# Patient Record
Sex: Female | Born: 1935
Health system: Southern US, Community
[De-identification: ages and names within clinical notes are randomized; demographics above are authoritative.]

## PROBLEM LIST (undated history)

## (undated) DIAGNOSIS — E785 Hyperlipidemia, unspecified: Secondary | ICD-10-CM

## (undated) DIAGNOSIS — E039 Hypothyroidism, unspecified: Secondary | ICD-10-CM

## (undated) DIAGNOSIS — R0600 Dyspnea, unspecified: Secondary | ICD-10-CM

## (undated) DIAGNOSIS — F419 Anxiety disorder, unspecified: Secondary | ICD-10-CM

## (undated) DIAGNOSIS — E079 Disorder of thyroid, unspecified: Secondary | ICD-10-CM

## (undated) DIAGNOSIS — F32A Depression, unspecified: Secondary | ICD-10-CM

## (undated) DIAGNOSIS — M1990 Unspecified osteoarthritis, unspecified site: Secondary | ICD-10-CM

## (undated) DIAGNOSIS — C439 Malignant melanoma of skin, unspecified: Secondary | ICD-10-CM

## (undated) DIAGNOSIS — I10 Essential (primary) hypertension: Secondary | ICD-10-CM

## (undated) DIAGNOSIS — F329 Major depressive disorder, single episode, unspecified: Secondary | ICD-10-CM

## (undated) DIAGNOSIS — C801 Malignant (primary) neoplasm, unspecified: Secondary | ICD-10-CM

## (undated) HISTORY — DX: Essential (primary) hypertension: I10

## (undated) HISTORY — DX: Disorder of thyroid, unspecified: E07.9

## (undated) HISTORY — DX: Malignant (primary) neoplasm, unspecified: C80.1

## (undated) HISTORY — DX: Depression, unspecified: F32.A

## (undated) HISTORY — DX: Malignant melanoma of skin, unspecified: C43.9

## (undated) HISTORY — DX: Hyperlipidemia, unspecified: E78.5

## (undated) HISTORY — PX: DILATION AND CURETTAGE OF UTERUS: SHX78

## (undated) HISTORY — PX: EXCISION OF BACK LESION: SHX6597

---
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
## 1978-04-06 HISTORY — PX: TUBAL LIGATION: SHX77

## 1999-03-20 DIAGNOSIS — C4492 Squamous cell carcinoma of skin, unspecified: Secondary | ICD-10-CM

## 1999-03-20 HISTORY — DX: Squamous cell carcinoma of skin, unspecified: C44.92

## 2016-04-01 ENCOUNTER — Ambulatory Visit: Payer: Medicare HMO | Admitting: Physical Therapy

## 2016-04-02 ENCOUNTER — Ambulatory Visit: Payer: Medicare HMO | Attending: Orthopedic Surgery | Admitting: Physical Therapy

## 2016-04-02 DIAGNOSIS — G8929 Other chronic pain: Secondary | ICD-10-CM | POA: Diagnosis present

## 2016-04-02 DIAGNOSIS — R293 Abnormal posture: Secondary | ICD-10-CM | POA: Diagnosis present

## 2016-04-02 DIAGNOSIS — M545 Low back pain: Secondary | ICD-10-CM | POA: Insufficient documentation

## 2016-04-02 NOTE — Therapy (Signed)
[facility] [place], [place], [address] Phone: [phone]   Fax:  [phone]  Physical Therapy Evaluation  Patient Details  Name: Mary Marks MRN: [id number] Date of Birth: 02/05/1936 Referring Provider: Ronette Deter PA-C  Encounter Date: 04/02/2016      PT End of Session - 04/02/16 1205    Visit Number 1   Number of Visits 16   Date for PT Re-Evaluation 06/01/16   PT Start Time 0947   PT Stop Time 1042   PT Time Calculation (min) 55 min   Activity Tolerance Patient tolerated treatment well   Behavior During Therapy [facility] for tasks assessed/performed      No past medical history on file.  No past surgical history on file.  There were no vitals filed for this visit.       Subjective Assessment - 04/02/16 1210    Subjective The patient reports she has had low back pain for the last 5 years.  However, as of late her back pain has been worsening which includes pain radiating down her right LE to the level of her knee.  She recently received an injection which she states helped but she had two very long drives and the pain returned.  She reports that during Christmas time she was up alot do household activites and her became severe rated at a 9-10/10.  Medication decreases her pain and activity increases her pain.   Limitations Sitting;Standing;Walking   Patient Stated Goals Get out of pain.   Currently in Pain? Yes   Pain Score 9    Pain Location Back   Pain Orientation Right;Left;Mid;Lower   Pain Descriptors / Indicators Aching;Constant;Throbbing   Pain Type Chronic pain   Pain Onset More than a month ago   Pain Frequency Constant   Aggravating Factors  See above.   Pain Relieving Factors See above.            [facility] PT Assessment - 04/02/16 0001      Assessment   Medical Diagnosis Acute right sided low back pain.   Referring Provider Ronette Deter PA-C   Onset Date/Surgical Date --  5 years.     Precautions   Precautions None     Restrictions   Weight Bearing Restrictions No     Balance Screen   Has the patient fallen in the past 6 months Yes   How many times? --  2.   Has the patient had a decrease in activity level because of a fear of falling?  Yes   Is the patient reluctant to leave their home because of a fear of falling?  Yes     Prior Function   Level of Independence Independent     Posture/Postural Control   Posture/Postural Control Postural limitations   Postural Limitations Rounded Shoulders;Forward head;Decreased lumbar lordosis;Posterior pelvic tilt;Flexed trunk   Posture Comments Bilateral genu valgum.     ROM / Strength   AROM / PROM / Strength AROM;Strength     AROM   Overall AROM Comments Lumbar flexion limited by 50% and lumbar extension. limites to neutral.     Strength   Overall Strength Comments Normal bilateral Le strength.     Palpation   Palpation comment Tender at L4 to S1 and across bilaterally from this region right > left with radiation of pain into her right LE to level of her knee.     Special Tests    Special Tests Lumbar  c/o pain with  SLR testing;(=) leg lengths;(-)FABER testing.   Lumbar Tests --  Essentially absent bilateral LE DTR's.     Ambulation/Gait   Gait Comments Antalgic gait with patient walking in flexion in obvious pain.                   [facility] Adult PT Treatment/Exercise - Apr 14, 2016 0001      Modalities   Modalities Electrical Stimulation;Moist Heat     Moist Heat Therapy   Number Minutes Moist Heat 15 Minutes   Moist Heat Location Lumbar Spine     Electrical Stimulation   Electrical Stimulation Location Low back   Electrical Stimulation Action IFC   Electrical Stimulation Parameters 80-150 Hz at 100% scan x 15 minutes.   Electrical Stimulation Goals Pain                  PT Short Term Goals - 2016/04/14 1236      PT SHORT TERM GOAL #1   Title STG's=LTG's.           PT Long  Term Goals - 2016/04/14 1236      PT LONG TERM GOAL #1   Title Independent with a HEP.   Time 8   Period Weeks   Status New     PT LONG TERM GOAL #2   Title Perform ADL's with pain not > 3/10.   Time 8   Period Weeks   Status New     PT LONG TERM GOAL #3   Title Eliminate right LE pain.   Time 8   Period Weeks   Status New               Plan - 04/14/2016 1215    Clinical Impression Statement The patient is in severe pain today with limitations of spinal range of motion.  Her pain limits her greatly her ability to perform ADL's.  This situation is evolving and worsenening.  She walks in spinal flexion due to her high pain-level.   Rehab Potential Good   PT Frequency 2x / week   PT Duration 8 weeks   PT Treatment/Interventions ADLs/Self Care Home Management;Cryotherapy;Electrical Stimulation;Moist Heat;Ultrasound;Patient/family education;Therapeutic exercise;Therapeutic activities;Manual techniques;Dry needling   PT Next Visit Plan Modalites and STW/M to patient's lower lumbar region f/b low-lwvwl core exercise progression.   Consulted and Agree with Plan of Care Patient      Patient will benefit from skilled therapeutic intervention in order to improve the following deficits and impairments:  Pain, Decreased activity tolerance, Decreased range of motion, Postural dysfunction  Visit Diagnosis: Chronic right-sided low back pain, with sciatica presence unspecified  Abnormal posture      G-Codes - 14-Apr-2016 1206    Functional Assessment Tool Used Clinical judgement...   Functional Limitation Mobility: Walking and moving around   Mobility: Walking and Moving Around Current Status [phone]) At least 40 percent but less than 60 percent impaired, limited or restricted   Mobility: Walking and Moving Around Goal Status [phone]) At least 1 percent but less than 20 percent impaired, limited or restricted       Problem List There are no active problems to display for this  patient.   APPLEGATE, [place] MPT 14-Apr-2016, 12:38 PM  [facility] [place], [place], [address] Phone: [phone]   Fax:  [phone]  Name: Mary Marks MRN: [id number] Date of Birth: April 25, 1935

## 2016-04-07 ENCOUNTER — Ambulatory Visit: Payer: Medicare HMO | Attending: Orthopedic Surgery | Admitting: Physical Therapy

## 2016-04-07 DIAGNOSIS — R293 Abnormal posture: Secondary | ICD-10-CM | POA: Insufficient documentation

## 2016-04-07 DIAGNOSIS — G8929 Other chronic pain: Secondary | ICD-10-CM | POA: Diagnosis present

## 2016-04-07 DIAGNOSIS — M545 Low back pain: Secondary | ICD-10-CM | POA: Diagnosis not present

## 2016-04-07 NOTE — Patient Instructions (Signed)
Quadriceps (Prone)   On stomach with sheet around ankles, knees together, hips down, pull heels toward bottom. Keep hips flat. Hold __60__ seconds. Repeat _3__ times. Do _2-3__ sessions per day. CAUTION: Stretch should be gentle, steady and slow.  Leg Extension (Hamstring)   Sit toward front edge of chair, with leg out straight, heel on floor or on a table, toes pointing toward body. Keeping back straight, bend forward at hip, until a stretch is felt. Hold 30-60 seconds. Repeat _3__ times. Repeat with other leg. Do __2-3_ sessions per day.  Mary Marks, PT 04/07/16 2:35 PM [facility] [place], [place], [address] Phone: [phone]   Fax:  [phone]

## 2016-04-07 NOTE — Therapy (Signed)
[facility] [place], [place], [address] Phone: [phone]   Fax:  [phone]  Physical Therapy Treatment  Patient Details  Name: Mary Marks MRN: [id number] Date of Birth: Nov 20, 1935 Referring Provider: Ronette Deter PA-C  Encounter Date: 04/07/2016      PT End of Session - 04/07/16 1353    Visit Number 2   Number of Visits 16   Date for PT Re-Evaluation 06/01/16   PT Start Time 0037   PT Stop Time 1450   PT Time Calculation (min) 57 min   Activity Tolerance Patient tolerated treatment well   Behavior During Therapy [facility] for tasks assessed/performed      No past medical history on file.  No past surgical history on file.  There were no vitals filed for this visit.      Subjective Assessment - 04/07/16 1353    Subjective Patient reports her pain comes and goes. Her pain feels better when she is moving.   Patient Stated Goals Get out of pain.   Currently in Pain? Yes   Pain Score 6    Pain Location Back   Pain Orientation Right;Left                         [facility] Adult PT Treatment/Exercise - 04/07/16 0001      Exercises   Exercises Lumbar     Lumbar Exercises: Stretches   Active Hamstring Stretch 1 rep;60 seconds   Active Hamstring Stretch Limitations seated   Quadruped Mid Back Stretch 1 rep;20 seconds  done in standing   Quadruped Mid Back Stretch Limitations patient only felt slightly   Quad Stretch 2 reps;60 seconds  B; with strap     Modalities   Modalities Electrical Stimulation;Moist Heat     Moist Heat Therapy   Number Minutes Moist Heat 15 Minutes   Moist Heat Location Lumbar Spine     Electrical Stimulation   Electrical Stimulation Location lumbar paraspinals IFC 80-150 Hz x 15 min    Electrical Stimulation Goals Pain     Manual Therapy   Manual Therapy Soft tissue mobilization;Myofascial release   Soft tissue mobilization B lumbar paraspinals and QL   Myofascial  Release to lumbar paraspinals                PT Education - 04/07/16 1559    Education provided Yes   Education Details HEP   Person(s) Educated Patient;Spouse   Methods Explanation;Demonstration;Handout   Comprehension Verbalized understanding;Returned demonstration          PT Short Term Goals - 04/02/16 1236      PT SHORT TERM GOAL #1   Title STG's=LTG's.           PT Long Term Goals - 04/02/16 1236      PT LONG TERM GOAL #1   Title Independent with a HEP.   Time 8   Period Weeks   Status New     PT LONG TERM GOAL #2   Title Perform ADL's with pain not > 3/10.   Time 8   Period Weeks   Status New     PT LONG TERM GOAL #3   Title Eliminate right LE pain.   Time 8   Period Weeks   Status New               Plan - 04/07/16 1601    Clinical Impression Statement Patient demos extreme tightness in B  quadriceps. She tolerated stretches very well to HS as well. She did well with deep tissue work as well. Normal response to modalities. No goals met as only second visit.   Rehab Potential Good   PT Frequency 2x / week   PT Duration 8 weeks   PT Treatment/Interventions ADLs/Self Care Home Management;Cryotherapy;Electrical Stimulation;Moist Heat;Ultrasound;Patient/family education;Therapeutic exercise;Therapeutic activities;Manual techniques;Dry needling   PT Next Visit Plan Modalites and STW/M to patient's lower lumbar region f/b low-lwvwl core exercise progression.   Consulted and Agree with Plan of Care Patient      Patient will benefit from skilled therapeutic intervention in order to improve the following deficits and impairments:  Pain, Decreased activity tolerance, Decreased range of motion, Postural dysfunction  Visit Diagnosis: Chronic right-sided low back pain, with sciatica presence unspecified  Abnormal posture     Problem List There are no active problems to display for this patient.  Madelyn Flavors PT 04/07/2016, 4:04 PM  [facility] [place], [place], [address] Phone: [phone]   Fax:  [phone]  Name: Mary Marks MRN: [id number] Date of Birth: 16-Feb-1936

## 2016-04-09 ENCOUNTER — Ambulatory Visit: Payer: Medicare HMO | Admitting: Physical Therapy

## 2016-04-09 DIAGNOSIS — G8929 Other chronic pain: Secondary | ICD-10-CM

## 2016-04-09 DIAGNOSIS — M545 Low back pain: Principal | ICD-10-CM

## 2016-04-09 DIAGNOSIS — R293 Abnormal posture: Secondary | ICD-10-CM

## 2016-04-09 NOTE — Therapy (Signed)
[facility] [place], [place], [address] Phone: [phone]   Fax:  [phone]  Physical Therapy Treatment  Patient Details  Name: Mary Marks MRN: [id number] Date of Birth: 07-03-1935 Referring Provider: Ronette Deter PA-C  Encounter Date: 04/09/2016      PT End of Session - 04/09/16 1357    Visit Number 3   Number of Visits 16   Date for PT Re-Evaluation 06/01/16   PT Start Time [id number]   PT Stop Time 1433   PT Time Calculation (min) 50 min   Activity Tolerance Patient tolerated treatment well   Behavior During Therapy [facility] for tasks assessed/performed      No past medical history on file.  No past surgical history on file.  There were no vitals filed for this visit.      Subjective Assessment - 04/09/16 1353    Subjective Reports that she really hasn't had much pain since previous treatment. Had some pain waking and after getting up from dentist chair.   Limitations Sitting;Standing;Walking   Patient Stated Goals Get out of pain.   Currently in Pain? No/denies            [facility] PT Assessment - 04/09/16 0001      Assessment   Medical Diagnosis Acute right sided low back pain.   Next MD Visit 04/24/2016     Precautions   Precautions None     Restrictions   Weight Bearing Restrictions No                     [facility] Adult PT Treatment/Exercise - 04/09/16 0001      Lumbar Exercises: Stretches   Single Knee to Chest Stretch 30 seconds;2 reps  BEgan reporting pain with RLE   Single Knee to Chest Stretch Limitations --   Quad Stretch Other (comment)  Attempted but unable secondary to LBP in prone     Lumbar Exercises: Aerobic   Stationary Bike NuStep L5 x15 min     Modalities   Modalities Electrical Stimulation;Moist Heat     Moist Heat Therapy   Number Minutes Moist Heat 15 Minutes   Moist Heat Location Lumbar Spine     Electrical Stimulation   Electrical Stimulation Location B lumbar  paraspinals   Electrical Stimulation Action IFC   Electrical Stimulation Parameters 1-10 hz x15 min   Electrical Stimulation Goals Pain     Manual Therapy   Manual Therapy Soft tissue mobilization   Soft tissue mobilization STW to L lumbar paraspinals and QL to decrease tightness in R sidelying                  PT Short Term Goals - 04/02/16 1236      PT SHORT TERM GOAL #1   Title STG's=LTG's.           PT Long Term Goals - 04/02/16 1236      PT LONG TERM GOAL #1   Title Independent with a HEP.   Time 8   Period Weeks   Status New     PT LONG TERM GOAL #2   Title Perform ADL's with pain not > 3/10.   Time 8   Period Weeks   Status New     PT LONG TERM GOAL #3   Title Eliminate right LE pain.   Time 8   Period Weeks   Status New  Plan - 04/09/16 1553    Clinical Impression Statement Patient presented in clinic with denial of any current pain. Patient did experience low back discomfort upon waking this morning as well as when getting up from denist chair today. Patient able to tolerate NuStep warm up well with no complaints following end of warmup although patient reported that was more activity than she had done recently. Patient attempted quad and SKTC stretches today but experienced discomfort with both. Patient also presented with increased tightness of L lumbar paraspinals and QL although R lumbar musculature would have been assessed but unable secondary to patient lying in R sidelying. Normal modalities response noted following removal of the modalities. Patient experienced low back feeling "good" following end of treatment per patient report.    Rehab Potential Good   PT Frequency 2x / week   PT Duration 8 weeks   PT Treatment/Interventions ADLs/Self Care Home Management;Cryotherapy;Electrical Stimulation;Moist Heat;Ultrasound;Patient/family education;Therapeutic exercise;Therapeutic activities;Manual techniques;Dry needling   PT  Next Visit Plan Modalites and STW/M to patient's lower lumbar region f/b low-lwvwl core exercise progression.   Consulted and Agree with Plan of Care Patient      Patient will benefit from skilled therapeutic intervention in order to improve the following deficits and impairments:  Pain, Decreased activity tolerance, Decreased range of motion, Postural dysfunction  Visit Diagnosis: Chronic right-sided low back pain, with sciatica presence unspecified  Abnormal posture     Problem List There are no active problems to display for this patient.   Wynelle Fanny, PTA 04/09/2016, 3:59 PM  [facility] [address] [place], [place], [address] Phone: [phone]   Fax:  [phone]  Name: Mary Marks MRN: [id number] Date of Birth: 1936-03-25

## 2016-04-14 ENCOUNTER — Ambulatory Visit: Payer: Medicare HMO | Admitting: Physical Therapy

## 2016-04-14 DIAGNOSIS — M545 Low back pain: Secondary | ICD-10-CM | POA: Diagnosis not present

## 2016-04-14 DIAGNOSIS — G8929 Other chronic pain: Secondary | ICD-10-CM

## 2016-04-14 NOTE — Therapy (Signed)
[facility] [place], [place], [address] Phone: [phone]   Fax:  [phone]  Physical Therapy Treatment  Patient Details  Name: Mary Marks MRN: [id number] Date of Birth: May 19, 1935 Referring Provider: Ronette Deter PA-C  Encounter Date: 04/14/2016      PT End of Session - 04/14/16 1347    Visit Number 4   Number of Visits 16   Date for PT Re-Evaluation 06/01/16   PT Start Time [id number]   PT Stop Time 1448   PT Time Calculation (min) 61 min   Activity Tolerance Patient tolerated treatment well   Behavior During Therapy [facility] for tasks assessed/performed      No past medical history on file.  No past surgical history on file.  There were no vitals filed for this visit.      Subjective Assessment - 04/14/16 1352    Subjective Patient states that she has had increased pain since last visit in back and BLE. She states it is difficult to walk.   Limitations Sitting;Standing;Walking   Patient Stated Goals Get out of pain.   Currently in Pain? Yes   Pain Score 8                          [facility] Adult PT Treatment/Exercise - 04/14/16 0001      Lumbar Exercises: Stretches   Lower Trunk Rotation 5 reps;10 seconds   Pelvic Tilt --  10 reps with PT assist   Pelvic Tilt Limitations required TCs    Quad Stretch --  AA 20 reps of prone knee flex with prolonged hold at end.     Lumbar Exercises: Aerobic   Stationary Bike L1 x 5 min     Lumbar Exercises: Supine   Ab Set --   Bent Knee Raise --   Bridge 20 reps     Modalities   Modalities Electrical Stimulation;Moist Heat     Moist Heat Therapy   Number Minutes Moist Heat 15 Minutes   Moist Heat Location Lumbar Spine     Electrical Stimulation   Electrical Stimulation Location lumbar paraspinals IFC 80-150 Hz x 15 min    Electrical Stimulation Goals Pain     Manual Therapy   Manual Therapy Soft tissue mobilization;Myofascial release   Soft tissue  mobilization B lumbar paraspinals and QL   Myofascial Release to lumbar paraspinals                PT Education - 04/14/16 1529    Education provided Yes   Education Details HEP   Person(s) Educated Patient   Methods Explanation;Demonstration;Handout;Tactile cues;Verbal cues   Comprehension Verbalized understanding;Returned demonstration;Verbal cues required  for pelvic rocking          PT Short Term Goals - 04/02/16 1236      PT SHORT TERM GOAL #1   Title STG's=LTG's.           PT Long Term Goals - 04/02/16 1236      PT LONG TERM GOAL #1   Title Independent with a HEP.   Time 8   Period Weeks   Status New     PT LONG TERM GOAL #2   Title Perform ADL's with pain not > 3/10.   Time 8   Period Weeks   Status New     PT LONG TERM GOAL #3   Title Eliminate right LE pain.   Time 8   Period Weeks  Status New               Plan - 04/14/16 1524    Clinical Impression Statement Patient presented today with increased pain since last visit. She ambulated with an antalgic gait pattern. She tolerated the recumbant bike without c/o increased pain. She responded well to deep MFR and STW with R lumbar being somewhat tighter. She required TCs to perform pelvic rocking in supine, but reported no pain with bridging after (she had pain with earlier bridging). Both were issued for HEP.  Normal response to modalities and patient reported decreased pain at end of session.   PT Treatment/Interventions ADLs/Self Care Home Management;Cryotherapy;Electrical Stimulation;Moist Heat;Ultrasound;Patient/family education;Therapeutic exercise;Therapeutic activities;Manual techniques;Dry needling   PT Next Visit Plan Modalites and STW/M to patient's lower lumbar region. Continue with AA pelvic rocking and flexibility as well as core progression.   PT Home Exercise Plan pelvic rock, bridge, SKTC, LTR   Consulted and Agree with Plan of Care Patient      Patient will benefit from  skilled therapeutic intervention in order to improve the following deficits and impairments:  Pain, Decreased activity tolerance, Decreased range of motion, Postural dysfunction  Visit Diagnosis: Chronic right-sided low back pain, with sciatica presence unspecified     Problem List There are no active problems to display for this patient.   Madelyn Flavors PT 04/14/2016, 3:37 PM  [facility] [place], [place], [address] Phone: [phone]   Fax:  [phone]  Name: Niyonna Mcfate MRN: [id number] Date of Birth: [birth year]

## 2016-04-14 NOTE — Patient Instructions (Signed)
Pelvic rocking   Flatten back by tightening stomach muscles and buttocks. Then arch your back off the bed.  Repeat _10___ times per set. Do _1-3___ sets per session. Do __2__ sessions per day.  http://orth.exer.us/134    Bridge   Lie back, legs bent. Inhale, pressing hips up. Keeping ribs in, lengthen lower back. Exhale, rolling down along spine from top. Repeat 10-30 times. Do __2__ sessions per day.  Copyright  VHI. All rights reserved.    Copyright  VHI. All rights reserved. Knee to Chest (Flexion)   Pull knee toward chest. Feel stretch in lower back or buttock area. You may need to put the other leg straight to feel a better stretch. Breathing deeply, Hold __30__ seconds. Repeat with other knee. Repeat _3___ times each leg. Do _2___ sessions per day.    With hand behind right kn  Lower Trunk Rotation Stretch   Keeping back flat and feet together, rotate knees to left side. Hold __10__ seconds. Repeat __5__ times per set. Do ____ sets per session. Do _2_ sessions per day.  http://orth.exer.us/122   Madelyn Flavors, PT 04/14/16 2:24 PM [facility] [address] [place], [place], [address] Phone: [phone]   Fax:  [phone]

## 2016-04-16 ENCOUNTER — Encounter: Payer: Self-pay | Admitting: Physical Therapy

## 2016-04-16 ENCOUNTER — Ambulatory Visit: Payer: Medicare HMO | Admitting: Physical Therapy

## 2016-04-16 DIAGNOSIS — G8929 Other chronic pain: Secondary | ICD-10-CM

## 2016-04-16 DIAGNOSIS — M545 Low back pain: Principal | ICD-10-CM

## 2016-04-16 DIAGNOSIS — R293 Abnormal posture: Secondary | ICD-10-CM

## 2016-04-16 NOTE — Therapy (Signed)
[facility] [place], [place], [address] Phone: [phone]   Fax:  [phone]  Physical Therapy Treatment  Patient Details  Name: Mary Marks MRN: [id number] Date of Birth: 1935-07-04 Referring Provider: Ronette Deter PA-C  Encounter Date: 04/16/2016      PT End of Session - 04/16/16 1247    Visit Number 5   Number of Visits 16   Date for PT Re-Evaluation 06/01/16   PT Start Time [id number]   PT Stop Time 1316   PT Time Calculation (min) 46 min   Activity Tolerance Patient tolerated treatment well   Behavior During Therapy [facility] for tasks assessed/performed      History reviewed. No pertinent past medical history.  History reviewed. No pertinent surgical history.  There were no vitals filed for this visit.      Subjective Assessment - 04/16/16 1235    Subjective Patient arrived with walker today due to increased pain posiblr from mopping   Limitations Sitting;Standing;Walking   Patient Stated Goals Get out of pain.   Currently in Pain? Yes   Pain Score 6    Pain Location Back   Pain Orientation Right;Left;Lower   Pain Descriptors / Indicators Aching;Sore   Pain Type Chronic pain   Pain Onset More than a month ago   Pain Frequency Constant   Aggravating Factors  ADL's / prolong activity   Pain Relieving Factors at rest                         [facility] Adult PT Treatment/Exercise - 04/16/16 0001      Lumbar Exercises: Aerobic   Stationary Bike nustep L4 x22min UE/LE activity     Lumbar Exercises: Supine   Ab Set 20 reps;3 seconds   Bent Knee Raise 3 seconds  2x10     Moist Heat Therapy   Number Minutes Moist Heat 15 Minutes   Moist Heat Location Lumbar Spine     Electrical Stimulation   Electrical Stimulation Location lumbar paraspinals IFC 80-150 Hz x 15 min    Electrical Stimulation Goals Pain     Manual Therapy   Manual Therapy Soft tissue mobilization;Myofascial release   Soft tissue  mobilization B lumbar paraspinals and QL   Myofascial Release to lumbar paraspinals                  PT Short Term Goals - 04/02/16 1236      PT SHORT TERM GOAL #1   Title STG's=LTG's.           PT Long Term Goals - 04/16/16 1248      PT LONG TERM GOAL #1   Title Independent with a HEP.   Time 8   Period Weeks   Status On-going     PT LONG TERM GOAL #2   Title Perform ADL's with pain not > 3/10.   Time 8   Period Weeks   Status On-going     PT LONG TERM GOAL #3   Title Eliminate right LE pain.   Time 8   Period Weeks   Status On-going               Plan - 04/16/16 1248    Clinical Impression Statement Patient tolerated treatment well today. Patient reports more pain with prolong standing and ADL's. Educated patient on posture awareness techniques in all positions and with ADL's to avoid pain and injury. Patient understands importance of posture and  feels about 30% improvement thus far. Patient goals ongoing due to pain deficts    Rehab Potential Good   PT Frequency 2x / week   PT Duration 8 weeks   PT Treatment/Interventions ADLs/Self Care Home Management;Cryotherapy;Electrical Stimulation;Moist Heat;Ultrasound;Patient/family education;Therapeutic exercise;Therapeutic activities;Manual techniques;Dry needling   PT Next Visit Plan Modalites and STW/M to patient's lower lumbar region. Continue with AA pelvic rocking and flexibility as well as core progression. (MD. Rolena Infante 04/22/16)   Consulted and Agree with Plan of Care Patient      Patient will benefit from skilled therapeutic intervention in order to improve the following deficits and impairments:  Pain, Decreased activity tolerance, Decreased range of motion, Postural dysfunction  Visit Diagnosis: Chronic right-sided low back pain, with sciatica presence unspecified  Abnormal posture     Problem List There are no active problems to display for this patient.   Ger Ringenberg P,  PTA 04/16/2016, 1:20 PM  [facility] [address] [place], [place], [address] Phone: [phone]   Fax:  [phone]  Name: Mary Marks MRN: [id number] Date of Birth: 27-Apr-1935

## 2016-04-20 ENCOUNTER — Encounter: Payer: Medicare HMO | Admitting: Physical Therapy

## 2016-04-21 ENCOUNTER — Ambulatory Visit: Payer: Medicare HMO | Admitting: Physical Therapy

## 2016-04-21 DIAGNOSIS — G8929 Other chronic pain: Secondary | ICD-10-CM

## 2016-04-21 DIAGNOSIS — M545 Low back pain: Secondary | ICD-10-CM | POA: Diagnosis not present

## 2016-04-22 NOTE — Therapy (Signed)
[facility] [place], [place], [address] Phone: [phone]   Fax:  [phone]  Physical Therapy Treatment  Patient Details  Name: Mary Marks MRN: [id number] Date of Birth: 1936-02-14 Referring Provider: Ronette Deter PA-C  Encounter Date: 04/21/2016      PT End of Session - 04/21/16 1444    Visit Number 6   Number of Visits 16   Date for PT Re-Evaluation 06/01/16   PT Start Time [id number]   PT Stop Time 1539   PT Time Calculation (min) 55 min   Activity Tolerance Patient tolerated treatment well   Behavior During Therapy [facility] for tasks assessed/performed      No past medical history on file.  No past surgical history on file.  There were no vitals filed for this visit.      Subjective Assessment - 04/21/16 1445    Subjective Patient 14 min late for appt. Patient does not feel like she is making progress. She hurts all the time. She doesnt want to take her hydrocodone, but she does at times. And states it takes more to be effective, so she really doesn;t want to take them.   Patient Stated Goals Get out of pain.   Currently in Pain? Yes   Pain Score 10-Worst pain ever   Pain Location Back   Pain Orientation Right;Left;Lower   Pain Descriptors / Indicators Aching;Sore   Pain Type Chronic pain   Pain Onset More than a month ago   Pain Frequency Constant                                 PT Education - 04/21/16 1316    Education provided Yes   Education Details Reviewed current HEP and modified quad stretch (patient brought in hardcopy)   Person(s) Educated Patient   Methods Explanation;Demonstration;Tactile cues;Verbal cues   Comprehension Verbalized understanding;Returned demonstration;Verbal cues required;Tactile cues required;Need further instruction          PT Short Term Goals - 04/02/16 1236      PT SHORT TERM GOAL #1   Title STG's=LTG's.           PT Long Term Goals -  04/16/16 1248      PT LONG TERM GOAL #1   Title Independent with a HEP.   Time 8   Period Weeks   Status On-going     PT LONG TERM GOAL #2   Title Perform ADL's with pain not > 3/10.   Time 8   Period Weeks   Status On-going     PT LONG TERM GOAL #3   Title Eliminate right LE pain.   Time 8   Period Weeks   Status On-going               Plan - 04/21/16 1526    Clinical Impression Statement Patient presents today with concerns that her pain is getting worse not better. She requested to go over her current HEP which we did and modified the quad stretch to be done in standing instead of prone. Patient has difficulty following directions and even counting correctly. PT inquired about patient's current medications and she stated she would bring a current list as they are not listed in computer. Patient responds well to STW with noticeable decrease in tissue tension. PT advised using tennis ball for MFR and demonstrated for pt. Normal response to modalities and patient stated she  felt "good" at end of treatment   PT Treatment/Interventions ADLs/Self Care Home Management;Cryotherapy;Electrical Stimulation;Moist Heat;Ultrasound;Patient/family education;Therapeutic exercise;Therapeutic activities;Manual techniques;Dry needling   PT Next Visit Plan Modalites and STW/M to patient's lower lumbar region. Continue with AA pelvic rocking and flexibility as well as core progression. (MD. Rolena Infante 04/28/16 per pt)   PT Home Exercise Plan pelvic rock, bridge, SKTC, LTR, quad and hs stretches   Consulted and Agree with Plan of Care Patient      Patient will benefit from skilled therapeutic intervention in order to improve the following deficits and impairments:  Pain, Decreased activity tolerance, Decreased range of motion, Postural dysfunction  Visit Diagnosis: Chronic right-sided low back pain, with sciatica presence unspecified     Problem List There are no active problems to display for  this patient.  Treatment: Reviewed HEP (HS/quad stretches, SKTC, LTR, pelvic rocking in supine (Transverse Abdominus), bridging. Manual therapy to lumbar paraspinals x 5 min. Estim IFC 80-150 Hz x 15 min to lumbar paraspinals.  Almyra Free Lorn Butcher PT 04/22/2016, 1:28 PM  [facility] [address] [place], [place], [address] Phone: [phone]   Fax:  [phone]  Name: Dametria Gressman MRN: [id number] Date of Birth: Feb 17, 1936

## 2016-04-24 ENCOUNTER — Encounter: Payer: Medicare HMO | Admitting: Physical Therapy

## 2016-04-27 ENCOUNTER — Ambulatory Visit: Payer: Medicare HMO | Admitting: Physical Therapy

## 2016-04-27 DIAGNOSIS — G8929 Other chronic pain: Secondary | ICD-10-CM

## 2016-04-27 DIAGNOSIS — M545 Low back pain: Secondary | ICD-10-CM | POA: Diagnosis not present

## 2016-04-27 NOTE — Therapy (Signed)
[facility] [place], [place], [address] Phone: [phone]   Fax:  [phone]  Physical Therapy Treatment  Patient Details  Name: Mary Marks MRN: [id number] Date of Birth: Aug 30, 1935 Referring Provider: Ronette Deter PA-C  Encounter Date: 04/27/2016      PT End of Session - 04/27/16 1351    Visit Number 7   Number of Visits 16   Date for PT Re-Evaluation 06/01/16   PT Start Time 1351   PT Stop Time 1443   PT Time Calculation (min) 52 min   Activity Tolerance Patient tolerated treatment well   Behavior During Therapy [facility] for tasks assessed/performed      No past medical history on file.  No past surgical history on file.  There were no vitals filed for this visit.      Subjective Assessment - 04/27/16 1352    Subjective Patient states she saw Dr. Rolena Infante PA last week and was given prednisone to address her pain and may have another injection. The prednisone has eased up her pain.    Limitations Sitting;Standing;Walking   Patient Stated Goals Get out of pain.   Currently in Pain? Yes   Pain Score 1    Pain Location Back   Pain Orientation Left;Right;Lower   Pain Descriptors / Indicators Aching;Sore   Pain Type Chronic pain   Pain Onset More than a month ago   Pain Frequency Constant   Aggravating Factors  ADLS/prolonged  activity   Pain Relieving Factors at rest                         [facility] Adult PT Treatment/Exercise - 04/27/16 0001      Lumbar Exercises: Supine   Ab Set 10 reps;5 seconds   Heel Slides 10 reps  B   Bent Knee Raise 10 reps  B   Bridge 20 reps   Bridge Limitations able to lift full range today     Lumbar Exercises: Prone   Straight Leg Raise 10 reps  B   Other Prone Lumbar Exercises pelvic press 5 sec x 10; with knee bends, B knee bends x 5 each.     Modalities   Modalities Electrical Stimulation;Moist Heat     Moist Heat Therapy   Number Minutes Moist Heat 15  Minutes   Moist Heat Location Lumbar Spine     Electrical Stimulation   Electrical Stimulation Location lumbar paraspinals   Electrical Stimulation Action IFC   Electrical Stimulation Parameters 80-150 Hz   Electrical Stimulation Goals Pain                  PT Short Term Goals - 04/02/16 1236      PT SHORT TERM GOAL #1   Title STG's=LTG's.           PT Long Term Goals - 04/16/16 1248      PT LONG TERM GOAL #1   Title Independent with a HEP.   Time 8   Period Weeks   Status On-going     PT LONG TERM GOAL #2   Title Perform ADL's with pain not > 3/10.   Time 8   Period Weeks   Status On-going     PT LONG TERM GOAL #3   Title Eliminate right LE pain.   Time 8   Period Weeks   Status On-going               Plan -  04/27/16 1430    Clinical Impression Statement Patient did significantly better today since her pain was reduced. She still has difficulty counting with TE and PT must assist. She had decreased tissue tension in paraspinals today so manual therapy was held. Normal response to modalities for pain.   PT Treatment/Interventions ADLs/Self Care Home Management;Cryotherapy;Electrical Stimulation;Moist Heat;Ultrasound;Patient/family education;Therapeutic exercise;Therapeutic activities;Manual techniques;Dry needling   PT Next Visit Plan Continue with core strengthening as tolerated and flexibility. Modalites and STW/M to patient's lower lumbar region prn.   PT Home Exercise Plan pelvic rock, bridge, SKTC, LTR, quad and hs stretches      Patient will benefit from skilled therapeutic intervention in order to improve the following deficits and impairments:  Pain, Decreased activity tolerance, Decreased range of motion, Postural dysfunction  Visit Diagnosis: Chronic right-sided low back pain, with sciatica presence unspecified     Problem List There are no active problems to display for this patient.  Madelyn Flavors PT 04/27/2016, 2:37  PM  [facility] [place], [place], [address] Phone: [phone]   Fax:  [phone]  Name: Mary Marks MRN: [id number] Date of Birth: 28-Jan-1936

## 2016-05-04 ENCOUNTER — Ambulatory Visit: Payer: Medicare HMO | Admitting: Physical Therapy

## 2016-05-04 VITALS — BP 188/97 | HR 73

## 2016-05-04 NOTE — Therapy (Signed)
[facility] [place], [place], [address] Phone: [phone]   Fax:  [phone]  Patient Details  Name: Mary Marks MRN: [id number] Date of Birth: June 12, 1935 Referring Provider:  Melina Schools, MD  Encounter Date: 05/04/2016   Patient presented today for her appointment today at 1:45 pm and her spouse reported that he had been monitoring her BP since her visit with Dr. Rolena Infante and it has been extremely high. PT took her vitals today and her BP was 188/97 and pulse 73 bpm. Patient also reported significant pain in her R knee (laterally and post) beginning this morning. She did some exercise and then it felt a little better, but then she went to breakfast with her husband and her pain returned so badly that she was sick to her stomach and had to leave the restaurant. PT informed patient and spouse that [facility] policy prohibits treatment with systolic over 99991111 mm Hg in addition to her lower leg pain and agreed with her husband that they should f/u with her MD immediately.   No charge visit.  Madelyn Flavors PT 05/04/2016, 2:04 PM  [facility] [address] [place], [place], [address] Phone: [phone]   Fax:  [phone]

## 2016-05-05 ENCOUNTER — Ambulatory Visit: Payer: Medicare HMO | Admitting: Physical Therapy

## 2016-05-27 ENCOUNTER — Other Ambulatory Visit: Payer: Self-pay | Admitting: Physician Assistant

## 2017-01-03 ENCOUNTER — Other Ambulatory Visit (HOSPITAL_COMMUNITY): Payer: Self-pay | Admitting: Psychiatry

## 2017-01-16 ENCOUNTER — Other Ambulatory Visit (HOSPITAL_COMMUNITY): Payer: Self-pay | Admitting: Psychiatry

## 2017-01-22 ENCOUNTER — Other Ambulatory Visit (HOSPITAL_COMMUNITY): Payer: Self-pay | Admitting: Psychiatry

## 2017-05-28 ENCOUNTER — Ambulatory Visit: Payer: Medicare HMO | Admitting: Physical Therapy

## 2017-05-31 ENCOUNTER — Ambulatory Visit: Payer: Medicare HMO | Attending: Orthopedic Surgery | Admitting: Physical Therapy

## 2017-05-31 DIAGNOSIS — M25662 Stiffness of left knee, not elsewhere classified: Secondary | ICD-10-CM | POA: Diagnosis present

## 2017-05-31 DIAGNOSIS — R2681 Unsteadiness on feet: Secondary | ICD-10-CM | POA: Insufficient documentation

## 2017-05-31 DIAGNOSIS — G8929 Other chronic pain: Secondary | ICD-10-CM | POA: Diagnosis present

## 2017-05-31 DIAGNOSIS — R262 Difficulty in walking, not elsewhere classified: Secondary | ICD-10-CM | POA: Diagnosis present

## 2017-05-31 DIAGNOSIS — M25562 Pain in left knee: Secondary | ICD-10-CM | POA: Insufficient documentation

## 2017-05-31 NOTE — Therapy (Signed)
[facility] [place], [place], [address] Phone: [phone]   Fax:  [phone]  Physical Therapy Treatment  Patient Details  Name: Mary Marks MRN: [id number] Date of Birth: 1935/08/17 Referring Provider: Rod Can   Encounter Date: 05/31/2017  PT End of Session - 05/31/17 1700    Visit Number  1    Number of Visits  12    Date for PT Re-Evaluation  06/28/17    PT Start Time  1310    PT Stop Time  1346    PT Time Calculation (min)  36 min    Activity Tolerance  Patient tolerated treatment well    Behavior During Therapy  [facility] for tasks assessed/performed       No past medical history on file.  No past surgical history on file.  There were no vitals filed for this visit.  Subjective Assessment - 05/31/17 1319    Subjective  Patient arrives to physical therapy with complaints of L knee pain and stiffness. Patient reports pain began about 3 months ago. Patient states she has pain in the back of her knees, along the medial joint line and pain inferior to the medial joint line. Patient states she received a cortisone injection 2 weeks ago to which did not alleviate any pain. Patient stated she will receive a series of 3 "gel" shots beginning march 5th. Patient has difficulty walking, standing for greater than 30 minutes and difficulty with balance secondary to pain. Patient ambulates with a straight cane. Patient's goals are to improve movement and decrease pain to perform household duties.    Limitations  Standing;Walking;House hold activities    How long can you sit comfortably?  unlimited    How long can you stand comfortably?  20-30 mins    How long can you walk comfortably?  15 minutes    Diagnostic tests  x-ray    Patient Stated Goals  walk with improved posture, decrease pain,     Currently in Pain?  Yes    Pain Score  9     Pain Location  Knee    Pain Orientation  Lateral;Medial;Posterior    Pain Descriptors /  Indicators  Sharp;Sore;Aching    Pain Type  Chronic pain    Pain Onset  More than a month ago    Aggravating Factors   walking, house hold activities    Pain Relieving Factors  laying down, ice         [facility] PT Assessment - 05/31/17 0001      Assessment   Medical Diagnosis  L knee osteoarthritis    Referring Provider  Rod Can    Next MD Visit  06/08/17      Precautions   Precautions  None      Restrictions   Weight Bearing Restrictions  No      Balance Screen   Has the patient fallen in the past 6 months  No    Has the patient had a decrease in activity level because of a fear of falling?   No    Is the patient reluctant to leave their home because of a fear of falling?   No      Home Environment   Living Environment  Private residence    Living Arrangements  Spouse/significant other    Type of [place] to enter 12 to front door but enters from back dorr    Home  Layout  Multi-level    Alternate Level Stairs-Number of Steps  16 with railing on both sides      Prior Function   Level of Independence  Independent    Vocation  Retired      ROM / Strength   AROM / PROM / Strength  AROM;PROM;Strength      AROM   Overall AROM   Deficits;Due to pain    AROM Assessment Site  Knee    Right/Left Knee  Left    Left Knee Extension  -15    Left Knee Flexion  95      PROM   Overall PROM   Within functional limits for tasks performed;Due to pain    PROM Assessment Site  Knee    Right/Left Knee  Left    Left Knee Extension  -10    Left Knee Flexion  105      Strength   Overall Strength  Within functional limits for tasks performed    Overall Strength Comments  Left hip flexion 3+/5    Strength Assessment Site  Knee    Right/Left Knee  Left    Left Knee Flexion  4-/5    Left Knee Extension  4-/5      Palpation   Patella mobility  decreased patella mobility superiorly and inferiorly      Special Tests    Special Tests  Knee Special Tests     Knee Special tests   other;other2      other    Findings  Negative    Side   Left    Comments  Valgus stress test and Varus stress test      other   findings  Positive    Comments  Romberg test: unable to maintain semi-tandem stance for greater than 5 seconds without req. contact guard assist.      Transfers   Transfers  Independent with all Transfers      Ambulation/Gait   Assistive device  Straight cane    Gait Pattern  Step-through pattern;Decreased step length - right;Decreased step length - left;Decreased stance time - left;Right flexed knee in stance;Left flexed knee in stance;Wide base of support Genu valgum                          PT Education - 05/31/17 1659    Education provided  Yes    Education Details  LAQ, seated marches    Person(s) Educated  Patient    Methods  Explanation;Demonstration;Handout    Comprehension  Verbalized understanding       PT Short Term Goals - 04/02/16 1236      PT SHORT TERM GOAL #1   Title  STG's=LTG's.        PT Long Term Goals - 05/31/17 1713      PT LONG TERM GOAL #1   Title  Independent with a HEP.    Time  4    Period  Weeks    Status  New    Target Date  06/28/17      PT LONG TERM GOAL #2   Title  Patient will decrease pain to less than or equal to 2/10 in order to perfrom ADLs and household activities.    Time  4    Period  Weeks    Status  New      PT LONG TERM GOAL #3   Title  Patient will improve global L knee  strength to 4+/5 for improved stability while ambulating.    Time  4    Period  Weeks    Status  On-going    Target Date  06/28/17      PT LONG TERM GOAL #4   Title  Patient will improve L knee extension AROM to 0 degrees for improved stability during gait.    Time  4    Period  Weeks    Status  New    Target Date  06/28/17            Plan - 05/31/17 1708    Clinical Impression Statement  Patient is a 82 year old female who presents to physical therapy with L knee  pain and decreased AROM and PROM. Patient demonstrated decreased L knee strength. Patient demonstrated genu valgum with bilateral knee flexion during standing. Patient demonstrates antalgic gait pattern with decrease step lengths, bilateral increased knee flexion during stance, and bilateral foot flat contact with cane in R hand. Patient noted with positive Romberg; unable to maintain semi-tandem stance without requiring  CG assist. Patient cane was adjusted for her height and instructed proper sequencing with ambulating with a cane. Patient reported feeling more stable with adjustment. Patient would benefit from skilled physical therapy to improve strength, ROM, and gait to perform ADLs and home activities without reports of pain.     Clinical Presentation  Stable    Clinical Decision Making  Low    Rehab Potential  Fair    PT Frequency  2x / week    PT Duration  4 weeks    PT Treatment/Interventions  ADLs/Self Care Home Management;Cryotherapy;Electrical Stimulation;Therapeutic activities;Therapeutic exercise;Balance training;Neuromuscular re-education;Patient/family education;Manual techniques;Passive range of motion;Vasopneumatic Device    PT Next Visit Plan  Assess Berg balance scale. Begin with low level LE strengthening and AROM. modalites PRN for pain relief.    Consulted and Agree with Plan of Care  Patient       Patient will benefit from skilled therapeutic intervention in order to improve the following deficits and impairments:  Abnormal gait, Pain, Decreased activity tolerance, Decreased endurance, Decreased strength, Decreased range of motion, Hypomobility, Difficulty walking, Postural dysfunction, Decreased balance  Visit Diagnosis: Chronic pain of left knee  Stiffness of left knee, not elsewhere classified  Difficulty in walking, not elsewhere classified  Unsteadiness on feet     Problem List There are no active problems to display for this patient.   Gabriela Eves, PT,  DPT 05/31/2017, 5:32 PM  [facility] [place], [place], [address] Phone: [phone]   Fax:  [phone]  Name: Mary Marks MRN: [id number] Date of Birth: 1935-11-06

## 2017-05-31 NOTE — Patient Instructions (Signed)
   Mary Marks, PT, DPT [facility] [address] [place], [place], [address] Phone: [phone]   Fax:  [phone]  

## 2017-06-01 ENCOUNTER — Ambulatory Visit: Payer: Medicare HMO | Admitting: Physical Therapy

## 2017-06-01 DIAGNOSIS — M25662 Stiffness of left knee, not elsewhere classified: Secondary | ICD-10-CM

## 2017-06-01 DIAGNOSIS — M25562 Pain in left knee: Principal | ICD-10-CM

## 2017-06-01 DIAGNOSIS — R2681 Unsteadiness on feet: Secondary | ICD-10-CM

## 2017-06-01 DIAGNOSIS — G8929 Other chronic pain: Secondary | ICD-10-CM

## 2017-06-01 DIAGNOSIS — R262 Difficulty in walking, not elsewhere classified: Secondary | ICD-10-CM

## 2017-06-01 NOTE — Therapy (Signed)
[facility] [place], [place], [address] Phone: [phone]   Fax:  [phone]  Physical Therapy Treatment  Patient Details  Name: Mary Marks MRN: [id number] Date of Birth: [birth year] Referring Provider: Rod Can   Encounter Date: 06/01/2017  PT End of Session - 06/01/17 1305    Visit Number  2    Number of Visits  12    Date for PT Re-Evaluation  06/28/17    PT Start Time  1302    PT Stop Time  1344    PT Time Calculation (min)  42 min    Activity Tolerance  Patient tolerated treatment well    Behavior During Therapy  [facility] for tasks assessed/performed       No past medical history on file.  No past surgical history on file.  There were no vitals filed for this visit.  Subjective Assessment - 06/01/17 1306    Subjective  Patient reported feeling knees are feeling good. Patient stated right now, she does not have pain. Patient stated occasionally she feels "cracking" in the knees.    Limitations  Standing;Walking;House hold activities    How long can you sit comfortably?  unlimited    How long can you stand comfortably?  20-30 mins    How long can you walk comfortably?  15 minutes    Diagnostic tests  x-ray    Patient Stated Goals  walk with improved posture, decrease pain,     Currently in Pain?  No/denies    Pain Score  0-No pain         [facility] PT Assessment - 06/01/17 0001      Balance   Balance Assessed  Yes      Standardized Balance Assessment   Standardized Balance Assessment  Berg Balance Test      Berg Balance Test   Sit to Stand  Able to stand  independently using hands    Standing Unsupported  Able to stand safely 2 minutes    Sitting with Back Unsupported but Feet Supported on Floor or Stool  Able to sit safely and securely 2 minutes    Stand to Sit  Controls descent by using hands    Transfers  Able to transfer safely, definite need of hands    Standing Unsupported with Eyes Closed  Able  to stand 10 seconds with supervision    Standing Ubsupported with Feet Together  Needs help to attain position and unable to hold for 15 seconds genu valgum doesnt allow for feet together    From Standing, Reach Forward with Outstretched Arm  Can reach forward >12 cm safely (5")    From Standing Position, Pick up Object from Floor  Able to pick up shoe, needs supervision    From Standing Position, Turn to Look Behind Over each Shoulder  Turn sideways only but maintains balance    Turn 360 Degrees  Able to turn 360 degrees safely but slowly    Standing Unsupported, Alternately Place Feet on Step/Stool  Able to complete >2 steps/needs minimal assist    Standing Unsupported, One Foot in Front  Needs help to step but can hold 15 seconds    Standing on One Leg  Tries to lift leg/unable to hold 3 seconds but remains standing independently    Total Score  33                  [facility] Adult PT Treatment/Exercise - 06/01/17 0001  Exercises   Exercises  Knee/Hip      Knee/Hip Exercises: Aerobic   Nustep  Level 3 x12 minutes      Knee/Hip Exercises: Standing   Heel Raises  Both;20 reps    Hip Flexion  Stengthening;10 reps;Knee bent      Knee/Hip Exercises: Seated   Ball Squeeze  3 second hold x20     Clamshell with TheraBand  Red x20    Hamstring Curl  Strengthening;Right;2 sets;10 reps    Hamstring Limitations  red theraband             PT Education - 05/31/17 1659    Education provided  Yes    Education Details  LAQ, seated marches    Person(s) Educated  Patient    Methods  Explanation;Demonstration;Handout    Comprehension  Verbalized understanding          PT Long Term Goals - 05/31/17 1713      PT LONG TERM GOAL #1   Title  Independent with a HEP.    Time  4    Period  Weeks    Status  New    Target Date  06/28/17      PT LONG TERM GOAL #2   Title  Patient will decrease pain to less than or equal to 2/10 in order to perfrom ADLs and household  activities.    Time  4    Period  Weeks    Status  New      PT LONG TERM GOAL #3   Title  Patient will improve global L knee strength to 4+/5 for improved stability while ambulating.    Time  4    Period  Weeks    Status  On-going    Target Date  06/28/17      PT LONG TERM GOAL #4   Title  Patient will improve L knee extension AROM to 0 degrees for improved stability during gait.    Time  4    Period  Weeks    Status  New    Target Date  06/28/17            Plan - 06/01/17 1747    Clinical Impression Statement  Patient was able to complete exercises without any increase of pain. Patient's Berg Balance test score of 33/56 indicates a high fall risk. Patient required close supervision with exercises as patient noted with increased AP sway and bilateral knee flexion during standing exercises even with both UE support.     Clinical Presentation  Stable    Clinical Decision Making  Low    Rehab Potential  Fair    PT Frequency  2x / week    PT Duration  4 weeks    PT Treatment/Interventions  ADLs/Self Care Home Management;Cryotherapy;Electrical Stimulation;Therapeutic activities;Therapeutic exercise;Balance training;Neuromuscular re-education;Patient/family education;Manual techniques;Passive range of motion;Vasopneumatic Device    PT Next Visit Plan  Continue with balance and LE strengthening exercises. modalities PRN for pain relief.    Consulted and Agree with Plan of Care  Patient       Patient will benefit from skilled therapeutic intervention in order to improve the following deficits and impairments:  Abnormal gait, Pain, Decreased activity tolerance, Decreased endurance, Decreased strength, Decreased range of motion, Hypomobility, Difficulty walking, Postural dysfunction, Decreased balance  Visit Diagnosis: Chronic pain of left knee  Stiffness of left knee, not elsewhere classified  Difficulty in walking, not elsewhere classified  Unsteadiness on  feet  Problem List There are no active problems to display for this patient.   Torrie Mayers Kyleen Villatoro 06/01/2017, 5:58 PM  [facility] [place], [place], [address] Phone: [phone]   Fax:  [phone]  Name: Mary Marks MRN: [id number] Date of Birth: 1935-07-28

## 2017-06-03 ENCOUNTER — Ambulatory Visit: Payer: Medicare HMO | Admitting: Physical Therapy

## 2017-06-03 DIAGNOSIS — M25562 Pain in left knee: Secondary | ICD-10-CM | POA: Diagnosis not present

## 2017-06-03 DIAGNOSIS — G8929 Other chronic pain: Secondary | ICD-10-CM

## 2017-06-03 DIAGNOSIS — R262 Difficulty in walking, not elsewhere classified: Secondary | ICD-10-CM

## 2017-06-03 DIAGNOSIS — M25662 Stiffness of left knee, not elsewhere classified: Secondary | ICD-10-CM

## 2017-06-03 DIAGNOSIS — R2681 Unsteadiness on feet: Secondary | ICD-10-CM

## 2017-06-03 NOTE — Therapy (Addendum)
[facility] [place], [place], [address] Phone: [phone]   Fax:  [phone]  Physical Therapy Treatment  Patient Details  Name: Mary Marks MRN: [id number] Date of Birth: Sep 10, 1935 Referring Provider: Rod Can   Encounter Date: 06/03/2017       No past medical history on file.  No past surgical history on file.  There were no vitals filed for this visit.  Subjective Assessment - 06/03/17 1510    Subjective  Patient reported feeling good; L knee is not in pain right now.    Limitations  Standing;Walking;House hold activities    How long can you sit comfortably?  unlimited    How long can you stand comfortably?  20-30 mins    How long can you walk comfortably?  15 minutes    Diagnostic tests  x-ray    Patient Stated Goals  walk with improved posture, decrease pain,     Currently in Pain?  No/denies    Pain Score  0-No pain                      [facility] Adult PT Treatment/Exercise - 06/03/17 0001      Exercises   Exercises  Knee/Hip      Knee/Hip Exercises: Aerobic   Nustep  Level 4 x12 minutes      Knee/Hip Exercises: Seated   Long Arc Quad  Strengthening;Left;2 sets;10 reps    Hamstring Curl  Strengthening;Left      Knee/Hip Exercises: Supine   Short Arc Quad Sets  Strengthening;Both;2 sets;10 reps 5 second hold    Hip Adduction Isometric  --    Bridges  Strengthening;Both;2 sets;10 reps                  PT Long Term Goals - 05/31/17 1713      PT LONG TERM GOAL #1   Title  Independent with a HEP.    Time  4    Period  Weeks    Status  New    Target Date  06/28/17      PT LONG TERM GOAL #2   Title  Patient will decrease pain to less than or equal to 2/10 in order to perfrom ADLs and household activities.    Time  4    Period  Weeks    Status  New      PT LONG TERM GOAL #3   Title  Patient will improve global L knee strength to 4+/5 for improved stability while  ambulating.    Time  4    Period  Weeks    Status  On-going    Target Date  06/28/17      PT LONG TERM GOAL #4   Title  Patient will improve L knee extension AROM to 0 degrees for improved stability during gait.    Time  4    Period  Weeks    Status  New    Target Date  06/28/17            Plan - 06/03/17 1511    Clinical Impression Statement  Patient was able to complete exercises with no increase of pain and good techniques with exercises. Patient attempted bike however patient noted with increased pain in L hamstring and was unable to perform without L hip hiking. Patient educated importance of movement for knee arthritis. Patient reported understanding.    Clinical Presentation  Stable    Clinical Decision  Making  Low    Rehab Potential  Fair    PT Frequency  2x / week    PT Duration  4 weeks    PT Treatment/Interventions  ADLs/Self Care Home Management;Cryotherapy;Electrical Stimulation;Therapeutic activities;Therapeutic exercise;Balance training;Neuromuscular re-education;Patient/family education;Manual techniques;Passive range of motion;Vasopneumatic Device    PT Next Visit Plan  Continue with balance and LE strengthening exercises. modalities PRN for pain relief.    Consulted and Agree with Plan of Care  Patient       Patient will benefit from skilled therapeutic intervention in order to improve the following deficits and impairments:  Abnormal gait, Pain, Decreased activity tolerance, Decreased endurance, Decreased strength, Decreased range of motion, Hypomobility, Difficulty walking, Postural dysfunction, Decreased balance  Visit Diagnosis: Chronic pain of left knee  Stiffness of left knee, not elsewhere classified  Difficulty in walking, not elsewhere classified  Unsteadiness on feet     Problem List There are no active problems to display for this patient.  Gabriela Eves, PT, DPT 06/03/2017, 3:44 PM  [facility] [place], [place], [address] Phone: [phone]   Fax:  [phone]  Name: Mary Marks MRN: [id number] Date of Birth: 1935-06-08

## 2017-06-10 ENCOUNTER — Ambulatory Visit: Payer: Medicare HMO | Attending: Orthopedic Surgery | Admitting: Physical Therapy

## 2017-06-10 DIAGNOSIS — R262 Difficulty in walking, not elsewhere classified: Secondary | ICD-10-CM

## 2017-06-10 DIAGNOSIS — G8929 Other chronic pain: Secondary | ICD-10-CM | POA: Diagnosis present

## 2017-06-10 DIAGNOSIS — R2681 Unsteadiness on feet: Secondary | ICD-10-CM | POA: Insufficient documentation

## 2017-06-10 DIAGNOSIS — M25662 Stiffness of left knee, not elsewhere classified: Secondary | ICD-10-CM | POA: Insufficient documentation

## 2017-06-10 DIAGNOSIS — M25562 Pain in left knee: Secondary | ICD-10-CM | POA: Insufficient documentation

## 2017-06-10 NOTE — Therapy (Signed)
[facility] [place], [place], [address] Phone: [phone]   Fax:  [phone]  Physical Therapy Treatment  Patient Details  Name: Mary Marks MRN: [id number] Date of Birth: 01/10/1936 Referring Provider: Rod Can   Encounter Date: 06/10/2017  PT End of Session - 06/10/17 1411    Visit Number  4    Number of Visits  12    Date for PT Re-Evaluation  06/28/17    PT Start Time  7341    PT Stop Time  1429    PT Time Calculation (min)  41 min    Activity Tolerance  Patient tolerated treatment well    Behavior During Therapy  [facility] for tasks assessed/performed       No past medical history on file.  No past surgical history on file.  There were no vitals filed for this visit.  Subjective Assessment - 06/10/17 1412    Subjective  Patient reported feeling sore after last visit; patient is feeling better now. Patient received 1st shot of 4 last Friday. Patient stated MD said she will not feel change or difference until after she recieved all the shots.     Pertinent History  significant genu valgum    Limitations  Standing;Walking;House hold activities    How long can you sit comfortably?  unlimited    How long can you stand comfortably?  20-30 mins    How long can you walk comfortably?  15 minutes    Diagnostic tests  x-ray    Patient Stated Goals  walk with improved posture, decrease pain,     Currently in Pain?  No/denies         [facility] PT Assessment - 06/10/17 0001      Assessment   Medical Diagnosis  L knee osteoarthritis    Next MD Visit  06/08/17      Precautions   Precautions  None      Restrictions   Weight Bearing Restrictions  No                  [facility] Adult PT Treatment/Exercise - 06/10/17 0001      Knee/Hip Exercises: Aerobic   Nustep  Level 4 x15 minutes      Knee/Hip Exercises: Standing   Rocker Board  2 minutes AP with both UE support      Knee/Hip Exercises: Supine   Quad Sets   -- attempted; pain in posterior knee    Short Arc Quad Sets  Strengthening;Left;2 sets;10 reps 1# weight    Bridges with Ball Squeeze  Strengthening;Left;2 sets;10 reps    Straight Leg Raises  Strengthening;Left;2 sets;10 reps    Other Supine Knee/Hip Exercises  supine clam shells red x20                  PT Long Term Goals - 05/31/17 1713      PT LONG TERM GOAL #1   Title  Independent with a HEP.    Time  4    Period  Weeks    Status  New    Target Date  06/28/17      PT LONG TERM GOAL #2   Title  Patient will decrease pain to less than or equal to 2/10 in order to perfrom ADLs and household activities.    Time  4    Period  Weeks    Status  New      PT LONG TERM GOAL #3  Title  Patient will improve global L knee strength to 4+/5 for improved stability while ambulating.    Time  4    Period  Weeks    Status  On-going    Target Date  06/28/17      PT LONG TERM GOAL #4   Title  Patient will improve L knee extension AROM to 0 degrees for improved stability during gait.    Time  4    Period  Weeks    Status  New    Target Date  06/28/17            Plan - 06/10/17 1442    Clinical Impression Statement  Patient was able to complete exercises with no increase of pain. Patient noted with minimal AP sway during rockerboard requiring close supervision.     Clinical Presentation  Stable    Clinical Decision Making  Low    Rehab Potential  Fair    PT Frequency  2x / week    PT Duration  4 weeks    PT Treatment/Interventions  ADLs/Self Care Home Management;Cryotherapy;Electrical Stimulation;Therapeutic activities;Therapeutic exercise;Balance training;Neuromuscular re-education;Patient/family education;Manual techniques;Passive range of motion;Vasopneumatic Device    PT Next Visit Plan  Continue with balance and LE strengthening exercises. modalities PRN for pain relief.    Consulted and Agree with Plan of Care  Patient       Patient will benefit from skilled  therapeutic intervention in order to improve the following deficits and impairments:  Abnormal gait, Pain, Decreased activity tolerance, Decreased endurance, Decreased strength, Decreased range of motion, Hypomobility, Difficulty walking, Postural dysfunction, Decreased balance  Visit Diagnosis: Chronic pain of left knee  Stiffness of left knee, not elsewhere classified  Difficulty in walking, not elsewhere classified  Unsteadiness on feet     Problem List There are no active problems to display for this patient.  Gabriela Eves, PT, DPT 06/10/2017, 3:51 PM  [facility] [address] [place], [place], [address] Phone: [phone]   Fax:  [phone]  Name: Mary Marks MRN: [id number] Date of Birth: [birth year]

## 2017-06-14 ENCOUNTER — Encounter: Payer: Medicare HMO | Admitting: Physical Therapy

## 2017-06-15 DIAGNOSIS — M1712 Unilateral primary osteoarthritis, left knee: Secondary | ICD-10-CM | POA: Insufficient documentation

## 2017-06-16 ENCOUNTER — Encounter: Payer: Self-pay | Admitting: Physical Therapy

## 2017-06-16 ENCOUNTER — Ambulatory Visit: Payer: Medicare HMO | Admitting: Physical Therapy

## 2017-06-16 DIAGNOSIS — M25562 Pain in left knee: Secondary | ICD-10-CM | POA: Diagnosis not present

## 2017-06-16 DIAGNOSIS — R262 Difficulty in walking, not elsewhere classified: Secondary | ICD-10-CM

## 2017-06-16 DIAGNOSIS — R2681 Unsteadiness on feet: Secondary | ICD-10-CM

## 2017-06-16 DIAGNOSIS — G8929 Other chronic pain: Secondary | ICD-10-CM

## 2017-06-16 DIAGNOSIS — M25662 Stiffness of left knee, not elsewhere classified: Secondary | ICD-10-CM

## 2017-06-16 NOTE — Therapy (Signed)
[facility] [place], [place], [address] Phone: [phone]   Fax:  [phone]  Physical Therapy Treatment  Patient Details  Name: Mary Marks MRN: [id number] Date of Birth: May 21, 1935 Referring Provider: Rod Can   Encounter Date: 06/16/2017  PT End of Session - 06/16/17 1351    Visit Number  5    Number of Visits  12    Date for PT Re-Evaluation  06/28/17    PT Start Time  9798    PT Stop Time  1430    PT Time Calculation (min)  41 min    Activity Tolerance  Patient tolerated treatment well    Behavior During Therapy  [facility] for tasks assessed/performed       History reviewed. No pertinent past medical history.  History reviewed. No pertinent surgical history.  There were no vitals filed for this visit.  Subjective Assessment - 06/16/17 1350    Subjective  Reports that her knees are sore today.    Pertinent History  significant genu valgum    Limitations  Standing;Walking;House hold activities    How long can you sit comfortably?  unlimited    How long can you stand comfortably?  20-30 mins    How long can you walk comfortably?  15 minutes    Diagnostic tests  x-ray    Patient Stated Goals  walk with improved posture, decrease pain,     Currently in Pain?  Yes    Pain Score  -- No pain score provided by patient    Pain Location  Knee    Pain Orientation  Left;Right    Pain Descriptors / Indicators  Sore    Pain Type  Chronic pain    Pain Onset  More than a month ago         [facility] PT Assessment - 06/16/17 0001      Assessment   Medical Diagnosis  L knee osteoarthritis    Next MD Visit  06/08/17      Precautions   Precautions  None      Restrictions   Weight Bearing Restrictions  No                  [facility] Adult PT Treatment/Exercise - 06/16/17 0001      Knee/Hip Exercises: Aerobic   Nustep  L5 x15 min      Knee/Hip Exercises: Seated   Hamstring Curl  Strengthening;Both;20  reps;Limitations    Hamstring Limitations  Red theraband      Knee/Hip Exercises: Supine   Short Arc Quad Sets  Strengthening;Both;20 reps;Limitations    Short Arc Quad Sets Limitations  2#    Heel Slides  AROM;Both;15 reps    Bridges with [employer]  Strengthening;Left;2 sets;10 reps    Straight Leg Raises  Strengthening;Left;2 sets;10 reps    Other Supine Knee/Hip Exercises  Supine clam red theraband 2x10 reps                  PT Long Term Goals - 05/31/17 1713      PT LONG TERM GOAL #1   Title  Independent with a HEP.    Time  4    Period  Weeks    Status  New    Target Date  06/28/17      PT LONG TERM GOAL #2   Title  Patient will decrease pain to less than or equal to 2/10 in order to perfrom ADLs and household activities.  Time  4    Period  Weeks    Status  New      PT LONG TERM GOAL #3   Title  Patient will improve global L knee strength to 4+/5 for improved stability while ambulating.    Time  4    Period  Weeks    Status  On-going    Target Date  06/28/17      PT LONG TERM GOAL #4   Title  Patient will improve L knee extension AROM to 0 degrees for improved stability during gait.    Time  4    Period  Weeks    Status  New    Target Date  06/28/17            Plan - 06/16/17 1507    Clinical Impression Statement  Patient tolerated today's treatment well with no reports of any increased pain. Patient reported increased B knee soreness upon arrival but unable to provide pain score. Patient able to tolerate all knee strengthening exercises well with VCs and tactile cues for proper form. Patient continues to ambulate slowly and with [facility] upon arrival into clinic but with B genu valgum.    Rehab Potential  Fair    PT Frequency  2x / week    PT Duration  4 weeks    PT Treatment/Interventions  ADLs/Self Care Home Management;Cryotherapy;Electrical Stimulation;Therapeutic activities;Therapeutic exercise;Balance training;Neuromuscular  re-education;Patient/family education;Manual techniques;Passive range of motion;Vasopneumatic Device    PT Next Visit Plan  Continue with balance and LE strengthening exercises. modalities PRN for pain relief.    Consulted and Agree with Plan of Care  Patient       Patient will benefit from skilled therapeutic intervention in order to improve the following deficits and impairments:  Abnormal gait, Pain, Decreased activity tolerance, Decreased endurance, Decreased strength, Decreased range of motion, Hypomobility, Difficulty walking, Postural dysfunction, Decreased balance  Visit Diagnosis: Chronic pain of left knee  Stiffness of left knee, not elsewhere classified  Difficulty in walking, not elsewhere classified  Unsteadiness on feet     Problem List There are no active problems to display for this patient.   Standley Brooking, PTA 06/16/2017, 3:16 PM  [facility] [address] [place], [place], [address] Phone: [phone]   Fax:  [phone]  Name: Mary Marks MRN: [id number] Date of Birth: [birth year]

## 2017-06-21 ENCOUNTER — Ambulatory Visit: Payer: Medicare HMO | Admitting: Physical Therapy

## 2017-06-21 DIAGNOSIS — G8929 Other chronic pain: Secondary | ICD-10-CM

## 2017-06-21 DIAGNOSIS — M25562 Pain in left knee: Secondary | ICD-10-CM | POA: Diagnosis not present

## 2017-06-21 DIAGNOSIS — M25662 Stiffness of left knee, not elsewhere classified: Secondary | ICD-10-CM

## 2017-06-21 DIAGNOSIS — R2681 Unsteadiness on feet: Secondary | ICD-10-CM

## 2017-06-21 DIAGNOSIS — R262 Difficulty in walking, not elsewhere classified: Secondary | ICD-10-CM

## 2017-06-21 NOTE — Therapy (Signed)
[facility] [place], [place], [address] Phone: [phone]   Fax:  [phone]  Physical Therapy Treatment  Patient Details  Name: Mary Marks MRN: [id number] Date of Birth: 1935-10-12 Referring Provider: Rod Can   Encounter Date: 06/21/2017  PT End of Session - 06/21/17 1435    Visit Number  6    Number of Visits  12    Date for PT Re-Evaluation  06/28/17    PT Start Time  4665    PT Stop Time  1520    PT Time Calculation (min)  47 min    Activity Tolerance  Patient tolerated treatment well    Behavior During Therapy  [facility] for tasks assessed/performed       No past medical history on file.  No past surgical history on file.  There were no vitals filed for this visit.  Subjective Assessment - 06/21/17 1436    Subjective  Patient reports she is having trouble walking. Pain reported 9/10 bilaterally. Patient to see doctor for 3 of 4 shots, tomorrow. Patient required to wait 6 weeks for final shot.    Pertinent History  significant genu valgum    Limitations  Standing;Walking;House hold activities    How long can you sit comfortably?  unlimited    How long can you stand comfortably?  20-30 mins    How long can you walk comfortably?  15 minutes    Diagnostic tests  x-ray    Patient Stated Goals  walk with improved posture, decrease pain,     Currently in Pain?  Yes    Pain Score  9     Pain Location  Knee    Pain Orientation  Right;Left    Pain Descriptors / Indicators  Sore    Pain Type  Chronic pain    Pain Onset  More than a month ago         [facility] PT Assessment - 06/21/17 0001      Assessment   Medical Diagnosis  L knee osteoarthritis    Next MD Visit  06/08/17      Precautions   Precautions  None      Restrictions   Weight Bearing Restrictions  No      ROM / Strength   AROM / PROM / Strength  AROM;Strength      AROM   Overall AROM   Deficits;Due to pain    Left Knee Extension  -15    Left  Knee Flexion  100      Strength   Overall Strength  Within functional limits for tasks performed    Left Knee Flexion  4+/5    Left Knee Extension  4+/5                  [facility] Adult PT Treatment/Exercise - 06/21/17 0001      Knee/Hip Exercises: Aerobic   Nustep  L5 x15 min      Knee/Hip Exercises: Seated   Long Arc Quad  Strengthening;Left;2 sets;10 reps    Hamstring Curl  Strengthening;Both;20 reps;Limitations    Hamstring Limitations  Red theraband    Sit to Sand  2 sets;5 reps;with UE support      Knee/Hip Exercises: Supine   Bridges with [employer]  Strengthening;Left;2 sets;10 reps      Knee/Hip Exercises: Sidelying   Clams  clam shells no band x20 each  PT Long Term Goals - 06/21/17 1514      PT LONG TERM GOAL #1   Title  Independent with a HEP.    Time  4    Period  Weeks    Status  Achieved      PT LONG TERM GOAL #2   Title  Patient will decrease pain to less than or equal to 2/10 in order to perfrom ADLs and household activities.    Baseline  unable to address pain scale "moderate" pain with household activities.    Time  4    Period  Weeks    Status  On-going      PT LONG TERM GOAL #3   Title  Patient will improve global L knee strength to 4+/5 for improved stability while ambulating.    Time  4    Period  Weeks    Status  Achieved      PT LONG TERM GOAL #4   Title  Patient will improve L knee extension AROM to 0 degrees for improved stability during gait.    Time  4    Period  Weeks    Status  On-going            Plan - 06/21/17 1511    Clinical Impression Statement  Patient was provided with more rest breaks secondary to knee pain. Patient was able to complete exercises with minimal increase of pain. Patient continues to require verbal and tactile cuing for proper form throughout the exercises. Patient has met LTG #1 and #3. Other goals are ongoing.    Clinical Presentation  Stable    Clinical Decision  Making  Low    Rehab Potential  Fair    PT Frequency  2x / week    PT Duration  4 weeks    PT Treatment/Interventions  ADLs/Self Care Home Management;Cryotherapy;Electrical Stimulation;Therapeutic activities;Therapeutic exercise;Balance training;Neuromuscular re-education;Patient/family education;Manual techniques;Passive range of motion;Vasopneumatic Device    PT Next Visit Plan  Continue with balance and LE strengthening exercises. modalities PRN for pain relief.    Consulted and Agree with Plan of Care  Patient       Patient will benefit from skilled therapeutic intervention in order to improve the following deficits and impairments:  Abnormal gait, Pain, Decreased activity tolerance, Decreased endurance, Decreased strength, Decreased range of motion, Hypomobility, Difficulty walking, Postural dysfunction, Decreased balance  Visit Diagnosis: Chronic pain of left knee  Stiffness of left knee, not elsewhere classified  Difficulty in walking, not elsewhere classified  Unsteadiness on feet     Problem List There are no active problems to display for this patient.  Gabriela Eves, PT, DPT 06/21/2017, 4:23 PM  [facility] [place], [place], [address] Phone: [phone]   Fax:  [phone]  Name: Aleja Yearwood MRN: [id number] Date of Birth: [birth year]

## 2017-06-23 ENCOUNTER — Ambulatory Visit: Payer: Medicare HMO | Admitting: Physical Therapy

## 2017-06-23 DIAGNOSIS — G8929 Other chronic pain: Secondary | ICD-10-CM

## 2017-06-23 DIAGNOSIS — M25562 Pain in left knee: Secondary | ICD-10-CM | POA: Diagnosis not present

## 2017-06-23 DIAGNOSIS — R2681 Unsteadiness on feet: Secondary | ICD-10-CM

## 2017-06-23 DIAGNOSIS — M25662 Stiffness of left knee, not elsewhere classified: Secondary | ICD-10-CM

## 2017-06-23 DIAGNOSIS — R262 Difficulty in walking, not elsewhere classified: Secondary | ICD-10-CM

## 2017-06-23 NOTE — Therapy (Signed)
[facility] [place], [place], [address] Phone: [phone]   Fax:  [phone]  Physical Therapy Treatment  Patient Details  Name: Mary Marks MRN: [id number] Date of Birth: 11/27/1935 Referring Provider: Rod Can   Encounter Date: 06/23/2017  PT End of Session - 06/23/17 1406    Visit Number  7    Number of Visits  12    Date for PT Re-Evaluation  06/28/17    PT Start Time  2229    PT Stop Time  1430    PT Time Calculation (min)  42 min    Activity Tolerance  Patient tolerated treatment well    Behavior During Therapy  [facility] for tasks assessed/performed       No past medical history on file.  No past surgical history on file.  There were no vitals filed for this visit.  Subjective Assessment - 06/23/17 1430    Subjective  Patient reported about 9/10 pain in bilateral knees.    Pertinent History  significant genu valgum    Limitations  Standing;Walking;House hold activities    How long can you sit comfortably?  unlimited    How long can you stand comfortably?  20-30 mins    How long can you walk comfortably?  15 minutes    Diagnostic tests  x-ray    Patient Stated Goals  walk with improved posture, decrease pain,     Pain Score  9     Pain Location  Knee    Pain Orientation  Right;Left    Pain Descriptors / Indicators  Sore    Pain Onset  More than a month ago         [facility] PT Assessment - 06/23/17 0001      Assessment   Medical Diagnosis  L knee osteoarthritis    Next MD Visit  06/08/17      Precautions   Precautions  None      Restrictions   Weight Bearing Restrictions  No                  [facility] Adult PT Treatment/Exercise - 06/23/17 0001      Knee/Hip Exercises: Aerobic   Nustep  L5 x15 min      Knee/Hip Exercises: Standing   Heel Raises  Both;20 reps    Knee Flexion  AROM;Strengthening;Both;2 sets;10 reps    Hip Abduction  AROM;Stengthening;Both;10 reps    Forward Step Up   Both;1 set;10 reps;Hand Hold: 2;Step Height: 6"      Knee/Hip Exercises: Seated   Long Arc Quad  Strengthening;Left;2 sets;10 reps;Limitations    Long Arc Quad Limitations  2#    Sit to [employer]  15 reps;with UE support                  PT Long Term Goals - 06/21/17 1514      PT LONG TERM GOAL #1   Title  Independent with a HEP.    Time  4    Period  Weeks    Status  Achieved      PT LONG TERM GOAL #2   Title  Patient will decrease pain to less than or equal to 2/10 in order to perfrom ADLs and household activities.    Baseline  unable to address pain scale "moderate" pain with household activities.    Time  4    Period  Weeks    Status  On-going  PT LONG TERM GOAL #3   Title  Patient will improve global L knee strength to 4+/5 for improved stability while ambulating.    Time  4    Period  Weeks    Status  Achieved      PT LONG TERM GOAL #4   Title  Patient will improve L knee extension AROM to 0 degrees for improved stability during gait.    Time  4    Period  Weeks    Status  On-going            Plan - 06/23/17 1424    Clinical Impression Statement  Patient was able to complete exercises and reported knee pain decreased with activity to 6/10. Patient stated she continues to get most of her pain when coming to standing after sitting for some time. Patient educated to perform LAQs before standing to help prepare joints for movement. Patient reported understanding. Patient noted with right hip circumduction while performing step ups. Patient cued to try to bring not to swing around and to increase hip flexion instead. Patient was able to perform about 50% of the time after cuing.     Clinical Presentation  Stable    Clinical Decision Making  Low    Rehab Potential  Fair    PT Frequency  2x / week    PT Duration  4 weeks    PT Treatment/Interventions  ADLs/Self Care Home Management;Cryotherapy;Electrical Stimulation;Therapeutic activities;Therapeutic  exercise;Balance training;Neuromuscular re-education;Patient/family education;Manual techniques;Passive range of motion;Vasopneumatic Device    PT Next Visit Plan  Continue with balance and LE strengthening exercises. modalities PRN for pain relief.    Consulted and Agree with Plan of Care  Patient       Patient will benefit from skilled therapeutic intervention in order to improve the following deficits and impairments:  Abnormal gait, Pain, Decreased activity tolerance, Decreased endurance, Decreased strength, Decreased range of motion, Hypomobility, Difficulty walking, Postural dysfunction, Decreased balance  Visit Diagnosis: Chronic pain of left knee  Stiffness of left knee, not elsewhere classified  Difficulty in walking, not elsewhere classified  Unsteadiness on feet     Problem List There are no active problems to display for this patient.  Gabriela Eves, PT, DPT 06/23/2017, 3:49 PM  [facility] [place], [place], [address] Phone: [phone]   Fax:  [phone]  Name: Katey Barrie MRN: [id number] Date of Birth: 1935-10-21

## 2017-06-28 ENCOUNTER — Encounter: Payer: Medicare HMO | Admitting: Physical Therapy

## 2017-06-30 ENCOUNTER — Ambulatory Visit: Payer: Medicare HMO | Admitting: Physical Therapy

## 2017-06-30 DIAGNOSIS — M25662 Stiffness of left knee, not elsewhere classified: Secondary | ICD-10-CM

## 2017-06-30 DIAGNOSIS — M25562 Pain in left knee: Principal | ICD-10-CM

## 2017-06-30 DIAGNOSIS — R262 Difficulty in walking, not elsewhere classified: Secondary | ICD-10-CM

## 2017-06-30 DIAGNOSIS — R2681 Unsteadiness on feet: Secondary | ICD-10-CM

## 2017-06-30 DIAGNOSIS — G8929 Other chronic pain: Secondary | ICD-10-CM

## 2017-06-30 NOTE — Therapy (Addendum)
[facility] [place], [place], [address] Phone: [phone]   Fax:  [phone]  Physical Therapy Treatment/Discharge  Patient Details  Name: Mary Marks MRN: [id number] Date of Birth: 1936-01-14 Referring Provider: Rod Can   Encounter Date: 06/30/2017  PT End of Session - 06/30/17 1401    Visit Number  8    Number of Visits  12    Date for PT Re-Evaluation  06/28/17    PT Start Time  4270    PT Stop Time  1433    PT Time Calculation (min)  48 min    Activity Tolerance  Patient tolerated treatment well    Behavior During Therapy  [facility] for tasks assessed/performed       No past medical history on file.  No past surgical history on file.   There were no vitals filed for this visit.  Subjective Assessment - 06/30/17 1404    Subjective  Patient reported she fell over the weekend. She had to crawl and scoot herself to a stable object to try to get up. She required assistance to stand from her son. She did not go to the hospital as she stated she felt like she didn't break anything. She stated she is sore all over her body mainly behind her knees.    Pertinent History  significant genu valgum    Limitations  Standing;Walking;House hold activities    How long can you sit comfortably?  unlimited    How long can you stand comfortably?  20-30 mins    How long can you walk comfortably?  15 minutes    Diagnostic tests  x-ray    Patient Stated Goals  walk with improved posture, decrease pain,     Currently in Pain?  Yes    Pain Score  3     Pain Location  Knee    Pain Orientation  Right;Left;Posterior    Pain Descriptors / Indicators  Sore    Pain Type  Chronic pain    Pain Onset  More than a month ago         [facility] PT Assessment - 06/30/17 0001      Assessment   Medical Diagnosis  L knee osteoarthritis            No data recorded       [facility] Adult PT Treatment/Exercise - 06/30/17 0001      Knee/Hip  Exercises: Aerobic   Nustep  L4 x15 min      Knee/Hip Exercises: Seated   Long Arc Quad  Strengthening;Left;2 sets;10 reps;Limitations    Long Arc Quad Limitations  1      Knee/Hip Exercises: Supine   Short Arc Quad Sets  Strengthening;Both;20 reps;Limitations    Bridges with [employer]  Strengthening;2 sets;10 reps    Straight Leg Raise with External Rotation  AROM;Both;2 sets;10 reps      Knee/Hip Exercises: Sidelying   Hip ABduction  AROM;Strengthening;Both;1 set;10 reps                  PT Long Term Goals - 06/21/17 1514      PT LONG TERM GOAL #1   Title  Independent with a HEP.    Time  4    Period  Weeks    Status  Achieved      PT LONG TERM GOAL #2   Title  Patient will decrease pain to less than or equal to 2/10 in order to perfrom ADLs  and household activities.    Baseline  unable to address pain scale "moderate" pain with household activities.    Time  4    Period  Weeks    Status  On-going      PT LONG TERM GOAL #3   Title  Patient will improve global L knee strength to 4+/5 for improved stability while ambulating.    Time  4    Period  Weeks    Status  Achieved      PT LONG TERM GOAL #4   Title  Patient will improve L knee extension AROM to 0 degrees for improved stability during gait.    Time  4    Period  Weeks    Status  On-going            Plan - 06/30/17 1406    Clinical Impression Statement  Patient's therex were regressed to seated and supine secondary to whole body soreness from her fall. Patient was able to complete exercises with rests. Patient stated despite fall, she reported an improvement with motion and pain. Patient and PT discussed continuing therapy for 1 more week to emphasize HEP then discharge to save visits for when she recieves her final injection in 6 weeks. Patient instructed that if she needs therapy after her final injection we can evaluate her again. Patient reported understanding and reported agreement.      Clinical Presentation  Stable    Rehab Potential  Fair    PT Frequency  2x / week    PT Duration  4 weeks    PT Treatment/Interventions  ADLs/Self Care Home Management;Cryotherapy;Electrical Stimulation;Therapeutic activities;Therapeutic exercise;Balance training;Neuromuscular re-education;Patient/family education;Manual techniques;Passive range of motion;Vasopneumatic Device    PT Next Visit Plan  Continue with balance and LE strengthening exercises. modalities PRN for pain relief.    Consulted and Agree with Plan of Care  Patient       Patient will benefit from skilled therapeutic intervention in order to improve the following deficits and impairments:  Abnormal gait, Pain, Decreased activity tolerance, Decreased endurance, Decreased strength, Decreased range of motion, Hypomobility, Difficulty walking, Postural dysfunction, Decreased balance  Visit Diagnosis: Chronic pain of left knee  Stiffness of left knee, not elsewhere classified  Difficulty in walking, not elsewhere classified  Unsteadiness on feet     Problem List There are no active problems to display for this patient.  PHYSICAL THERAPY DISCHARGE SUMMARY  Visits from Start of Care: 8  Current functional level related to goals / functional outcomes: See above   Remaining deficits: See goals   Education / Equipment: HEP  Plan: Patient agrees to discharge.  Patient goals were partially met. Patient is being discharged due to not returning since the last visit.  ?????        Gabriela Eves, PT, DPT 06/30/2017, 2:45 PM  [facility] [address] [place], [place], [address] Phone: [phone]   Fax:  [phone]  Name: Lorieann Argueta MRN: [id number] Date of Birth: [birth year]

## 2017-07-05 ENCOUNTER — Encounter: Payer: Medicare HMO | Admitting: Physical Therapy

## 2017-07-07 ENCOUNTER — Encounter: Payer: Medicare HMO | Admitting: Physical Therapy

## 2017-11-01 ENCOUNTER — Other Ambulatory Visit (HOSPITAL_COMMUNITY): Payer: Self-pay | Admitting: Psychiatry

## 2018-01-24 DIAGNOSIS — M179 Osteoarthritis of knee, unspecified: Secondary | ICD-10-CM | POA: Insufficient documentation

## 2019-05-11 ENCOUNTER — Other Ambulatory Visit: Payer: Self-pay

## 2019-05-12 ENCOUNTER — Encounter: Payer: Self-pay | Admitting: Physician Assistant

## 2019-05-12 ENCOUNTER — Ambulatory Visit (INDEPENDENT_AMBULATORY_CARE_PROVIDER_SITE_OTHER): Payer: Medicare HMO | Admitting: Physician Assistant

## 2019-05-12 ENCOUNTER — Encounter (INDEPENDENT_AMBULATORY_CARE_PROVIDER_SITE_OTHER): Payer: Self-pay

## 2019-05-12 VITALS — BP 145/80 | HR 45 | Temp 98.7°F | Ht 63.0 in | Wt 184.0 lb

## 2019-05-12 DIAGNOSIS — I10 Essential (primary) hypertension: Secondary | ICD-10-CM | POA: Insufficient documentation

## 2019-05-12 DIAGNOSIS — M51369 Other intervertebral disc degeneration, lumbar region without mention of lumbar back pain or lower extremity pain: Secondary | ICD-10-CM | POA: Insufficient documentation

## 2019-05-12 DIAGNOSIS — M170 Bilateral primary osteoarthritis of knee: Secondary | ICD-10-CM

## 2019-05-12 DIAGNOSIS — M1712 Unilateral primary osteoarthritis, left knee: Secondary | ICD-10-CM

## 2019-05-12 DIAGNOSIS — F3341 Major depressive disorder, recurrent, in partial remission: Secondary | ICD-10-CM | POA: Diagnosis not present

## 2019-05-12 DIAGNOSIS — M5136 Other intervertebral disc degeneration, lumbar region: Secondary | ICD-10-CM | POA: Insufficient documentation

## 2019-05-12 DIAGNOSIS — F32A Depression, unspecified: Secondary | ICD-10-CM | POA: Insufficient documentation

## 2019-05-12 DIAGNOSIS — E079 Disorder of thyroid, unspecified: Secondary | ICD-10-CM

## 2019-05-12 DIAGNOSIS — E559 Vitamin D deficiency, unspecified: Secondary | ICD-10-CM

## 2019-05-12 DIAGNOSIS — F329 Major depressive disorder, single episode, unspecified: Secondary | ICD-10-CM | POA: Insufficient documentation

## 2019-05-12 DIAGNOSIS — E039 Hypothyroidism, unspecified: Secondary | ICD-10-CM | POA: Insufficient documentation

## 2019-05-12 DIAGNOSIS — E785 Hyperlipidemia, unspecified: Secondary | ICD-10-CM | POA: Insufficient documentation

## 2019-05-12 DIAGNOSIS — F419 Anxiety disorder, unspecified: Secondary | ICD-10-CM | POA: Insufficient documentation

## 2019-05-12 NOTE — Progress Notes (Signed)
Acute Office Visit  Subjective:    Patient ID: Mary Marks, female    DOB: July 21, 1935, 84 y.o.   MRN: [id number]  Chief Complaint  Patient presents with  . Establish Care   lme 2.5   Hypertension This is a chronic problem. The current episode started more than 1 year ago. The problem is controlled. Associated symptoms include anxiety. Pertinent negatives include no chest pain. There are no known risk factors for coronary artery disease. Past treatments include diuretics and beta blockers. The current treatment provides moderate improvement.  Hyperlipidemia This is a chronic problem. The current episode started more than 1 year ago. The problem is controlled. Pertinent negatives include no chest pain. Current antihyperlipidemic treatment includes statins. The current treatment provides moderate improvement of lipids.  Arthritis Presents for follow-up visit. Affected locations include the left knee and right knee. Her pain is at a severity of 7/10. Pertinent negatives include no dysuria, fatigue or fever.  Anxiety Presents for follow-up visit. Symptoms include decreased concentration, excessive worry, irritability and nervous/anxious behavior. Patient reports no chest pain. Symptoms occur most days.       Past Medical History:  Diagnosis Date  . Cancer ([place])    Melanoma on back  . Depression   . Hyperlipidemia   . Hypertension   . Thyroid disease    Hypothyroid    Past Surgical History:  Procedure Laterality Date  . EXCISION OF BACK LESION     Melanoma    History reviewed. No pertinent family history.  Social History   Socioeconomic History  . Marital status: Married    Spouse name: Not on file  . Number of children: Not on file  . Years of education: Not on file  . Highest education level: Not on file  Occupational History  . Not on file  Tobacco Use  . Smoking status: Not on file  Substance and Sexual Activity  . Alcohol use: Not on file  . Drug use: Not on  file  . Sexual activity: Not on file  Other Topics Concern  . Not on file  Social History Narrative  . Not on file   Social Determinants of Health   Financial Resource Strain:   . Difficulty of Paying Living Expenses: Not on file  Food Insecurity:   . Worried About Charity fundraiser in the Last Year: Not on file  . Ran Out of Food in the Last Year: Not on file  Transportation Needs:   . Lack of Transportation (Medical): Not on file  . Lack of Transportation (Non-Medical): Not on file  Physical Activity:   . Days of Exercise per Week: Not on file  . Minutes of Exercise per Session: Not on file  Stress:   . Feeling of Stress : Not on file  Social Connections:   . Frequency of Communication with Friends and Family: Not on file  . Frequency of Social Gatherings with Friends and Family: Not on file  . Attends Religious Services: Not on file  . Active Member of Clubs or Organizations: Not on file  . Attends Archivist Meetings: Not on file  . Marital Status: Not on file  Intimate Partner Violence:   . Fear of Current or Ex-Partner: Not on file  . Emotionally Abused: Not on file  . Physically Abused: Not on file  . Sexually Abused: Not on file    Outpatient Medications Prior to Visit  Medication Sig Dispense Refill  . ALPRAZolam (XANAX) 0.5  MG tablet Take 0.5 mg by mouth 3 (three) times daily.    Marland Kitchen atorvastatin (LIPITOR) 20 MG tablet Take 20 mg by mouth daily.    Marland Kitchen CALCIUM CITRATE-VITAMIN D PO calcium citrate vitamin d    . celecoxib (CELEBREX) 100 MG capsule Take 100 mg by mouth daily.    Marland Kitchen levothyroxine (SYNTHROID) 88 MCG tablet Take 88 mcg by mouth daily.    . metoprolol succinate (TOPROL-XL) 100 MG 24 hr tablet Take 100 mg by mouth daily.    . Multiple Vitamin (MULTI-VITAMIN DAILY PO) complete senior vitamins and m    . pregabalin (LYRICA) 200 MG capsule Take 200 mg by mouth 2 (two) times daily.    . sertraline (ZOLOFT) 100 MG tablet Take 100 mg by mouth daily.     Marland Kitchen ALPRAZolam (XANAX) 1 MG tablet Take 1 mg by mouth 3 (three) times daily.    . fenofibrate 160 MG tablet Take 160 mg by mouth daily.    . furosemide (LASIX) 20 MG tablet Take 20 mg by mouth once.    Marland Kitchen levothyroxine (SYNTHROID, LEVOTHROID) 75 MCG tablet Take 75 mcg by mouth daily before breakfast.    . predniSONE (STERAPRED UNI-PAK 21 TAB) 10 MG (21) TBPK tablet Take 10 mg by mouth daily.    . furosemide (LASIX) 20 MG tablet furosemide 20 mg tablet    . HYDROcodone-acetaminophen (NORCO) 10-325 MG tablet Take 1-2 tablets by mouth every 6 (six) hours as needed.     No facility-administered medications prior to visit.    Allergies  Allergen Reactions  . Pravachol [Pravastatin] Rash    Review of Systems  Constitutional: Positive for irritability. Negative for activity change, fatigue and fever.  HENT: Negative.   Eyes: Negative.   Respiratory: Negative.  Negative for cough.   Cardiovascular: Negative.  Negative for chest pain.  Gastrointestinal: Negative.  Negative for abdominal pain.  Endocrine: Negative.   Genitourinary: Negative.  Negative for dysuria.  Musculoskeletal: Positive for arthralgias, arthritis, back pain and gait problem.  Skin: Negative.   Psychiatric/Behavioral: Positive for decreased concentration. The patient is nervous/anxious.        Objective:    Physical Exam Constitutional:      General: She is not in acute distress.    Appearance: Normal appearance. She is well-developed.  HENT:     Head: Normocephalic and atraumatic.  Cardiovascular:     Rate and Rhythm: Normal rate.  Pulmonary:     Effort: Pulmonary effort is normal.  Skin:    General: Skin is warm and dry.     Findings: No rash.  Neurological:     Mental Status: She is alert and oriented to person, place, and time.     Deep Tendon Reflexes: Reflexes are normal and symmetric.     BP (!) 145/80   Pulse (!) 45   Temp 98.7 F (37.1 C)   Ht 5' 3"  (1.6 m)   Wt 184 lb (83.5 kg)   SpO2 93%    BMI 32.59 kg/m  Wt Readings from Last 3 Encounters:  05/12/19 184 lb (83.5 kg)    Health Maintenance Due  Topic Date Due  . TETANUS/TDAP  10/04/1956  . DEXA SCAN  10/05/2002  . PNA vac Low Risk Adult (1 of 2 - PCV13) 10/05/2002  . INFLUENZA VACCINE  11/05/2018    There are no preventive care reminders to display for this patient.   No results found for: TSH No results found for: WBC, HGB, HCT, MCV,  PLT No results found for: NA, K, CHLORIDE, CO2, GLUCOSE, BUN, CREATININE, BILITOT, ALKPHOS, AST, ALT, PROT, ALBUMIN, CALCIUM, ANIONGAP, EGFR, GFR No results found for: CHOL No results found for: HDL No results found for: LDLCALC No results found for: TRIG No results found for: CHOLHDL No results found for: HGBA1C     Assessment & Plan:   Problem List Items Addressed This Visit      Cardiovascular and Mediastinum   Hypertension   Relevant Medications   metoprolol succinate (TOPROL-XL) 100 MG 24 hr tablet   furosemide (LASIX) 20 MG tablet   Other Relevant Orders   CMP14+EGFR   CBC with Differential/Platelet   Lipid panel   TSH   VITAMIN D 25 Hydroxy (Vit-D Deficiency, Fractures)     Endocrine   Thyroid disease   Relevant Medications   metoprolol succinate (TOPROL-XL) 100 MG 24 hr tablet   levothyroxine (SYNTHROID) 88 MCG tablet   Other Relevant Orders   CMP14+EGFR   CBC with Differential/Platelet   Lipid panel   TSH   VITAMIN D 25 Hydroxy (Vit-D Deficiency, Fractures)     Musculoskeletal and Integument   Osteoarthritis of left knee - Primary   Relevant Medications   celecoxib (CELEBREX) 100 MG capsule   Osteoarthritis of knee   Relevant Medications   celecoxib (CELEBREX) 100 MG capsule     Other   Depression   Relevant Medications   sertraline (ZOLOFT) 100 MG tablet   ALPRAZolam (XANAX) 0.5 MG tablet   Hyperlipidemia   Relevant Medications   metoprolol succinate (TOPROL-XL) 100 MG 24 hr tablet   furosemide (LASIX) 20 MG tablet   Other Relevant Orders    CMP14+EGFR   CBC with Differential/Platelet   Lipid panel   TSH   VITAMIN D 25 Hydroxy (Vit-D Deficiency, Fractures)    Other Visit Diagnoses    Vitamin D deficiency       Relevant Orders   VITAMIN D 25 Hydroxy (Vit-D Deficiency, Fractures)   Anxiety       Relevant Medications   sertraline (ZOLOFT) 100 MG tablet   ALPRAZolam (XANAX) 0.5 MG tablet   Other Relevant Orders   ToxASSURE Select 13 (MW), Urine       No orders of the defined types were placed in this encounter.    Terald Sleeper, PA-C   Terald Sleeper PA-C [place] [address]  [place], [place] [address] [phone]

## 2019-05-13 LAB — CBC WITH DIFFERENTIAL/PLATELET
Basophils Absolute: 0.1 10*3/uL (ref 0.0–0.2)
Basos: 1 %
EOS (ABSOLUTE): 0.6 10*3/uL — ABNORMAL HIGH (ref 0.0–0.4)
Eos: 7 %
Hematocrit: 44.1 % (ref 34.0–46.6)
Hemoglobin: 14.4 g/dL (ref 11.1–15.9)
Immature Grans (Abs): 0 10*3/uL (ref 0.0–0.1)
Immature Granulocytes: 0 %
Lymphocytes Absolute: 3.2 10*3/uL — ABNORMAL HIGH (ref 0.7–3.1)
Lymphs: 33 %
MCH: 28.4 pg (ref 26.6–33.0)
MCHC: 32.7 g/dL (ref 31.5–35.7)
MCV: 87 fL (ref 79–97)
Monocytes Absolute: 0.8 10*3/uL (ref 0.1–0.9)
Monocytes: 8 %
Neutrophils Absolute: 5 10*3/uL (ref 1.4–7.0)
Neutrophils: 51 %
Platelets: 247 10*3/uL (ref 150–450)
RBC: 5.07 x10E6/uL (ref 3.77–5.28)
RDW: 13.2 % (ref 11.7–15.4)
WBC: 9.6 10*3/uL (ref 3.4–10.8)

## 2019-05-13 LAB — CMP14+EGFR
ALT: 9 IU/L (ref 0–32)
AST: 12 IU/L (ref 0–40)
Albumin/Globulin Ratio: 1.7 (ref 1.2–2.2)
Albumin: 4.2 g/dL (ref 3.6–4.6)
Alkaline Phosphatase: 102 IU/L (ref 39–117)
BUN/Creatinine Ratio: 26 (ref 12–28)
BUN: 19 mg/dL (ref 8–27)
Bilirubin Total: 0.3 mg/dL (ref 0.0–1.2)
CO2: 26 mmol/L (ref 20–29)
Calcium: 10.3 mg/dL (ref 8.7–10.3)
Chloride: 105 mmol/L (ref 96–106)
Creatinine, Ser: 0.74 mg/dL (ref 0.57–1.00)
GFR calc Af Amer: 88 mL/min/{1.73_m2} (ref >59)
GFR calc non Af Amer: 76 mL/min/{1.73_m2} (ref >59)
Globulin, Total: 2.5 g/dL (ref 1.5–4.5)
Glucose: 103 mg/dL — ABNORMAL HIGH (ref 65–99)
Potassium: 4.7 mmol/L (ref 3.5–5.2)
Sodium: 143 mmol/L (ref 134–144)
Total Protein: 6.7 g/dL (ref 6.0–8.5)

## 2019-05-13 LAB — LIPID PANEL
Chol/HDL Ratio: 3 ratio (ref 0.0–4.4)
Cholesterol, Total: 190 mg/dL (ref 100–199)
HDL: 63 mg/dL (ref >39)
LDL Chol Calc (NIH): 103 mg/dL — ABNORMAL HIGH (ref 0–99)
Triglycerides: 138 mg/dL (ref 0–149)
VLDL Cholesterol Cal: 24 mg/dL (ref 5–40)

## 2019-05-13 LAB — TSH: TSH: 1.96 u[IU]/mL (ref 0.450–4.500)

## 2019-05-13 LAB — VITAMIN D 25 HYDROXY (VIT D DEFICIENCY, FRACTURES): Vit D, 25-Hydroxy: 26 ng/mL — ABNORMAL LOW (ref 30.0–100.0)

## 2019-05-16 ENCOUNTER — Encounter: Payer: Self-pay | Admitting: Physician Assistant

## 2019-05-24 ENCOUNTER — Other Ambulatory Visit: Payer: Self-pay | Admitting: Physician Assistant

## 2019-05-24 MED ORDER — ALPRAZOLAM 0.5 MG PO TABS: 0.5000 mg | ORAL_TABLET | Freq: Three times a day (TID) | ORAL | 0 refills | Status: DC

## 2019-05-24 NOTE — Telephone Encounter (Signed)
What is the name of the medication? Alprazolam 0.5 mg Had appt with Glenard Haring 2-5  Have you contacted your pharmacy to request a refill? NO   Which pharmacy would you like this sent to? [facility]   Patient notified that their request is being sent to the clinical staff for review and that they should receive a call once it is complete. If they do not receive a call within 24 hours they can check with their pharmacy or our office.

## 2019-05-24 NOTE — Telephone Encounter (Signed)
Please review and advise.

## 2019-05-24 NOTE — Telephone Encounter (Signed)
Patient aware.  She already has a follow up appointment with Particia Nearing on 06/14/19.

## 2019-05-24 NOTE — Telephone Encounter (Signed)
A prescription for 1 month sent to her mail-in pharmacy please make ointment for follow-up

## 2019-06-02 ENCOUNTER — Other Ambulatory Visit: Payer: Self-pay | Admitting: Physician Assistant

## 2019-06-02 ENCOUNTER — Telehealth: Payer: Self-pay | Admitting: Physician Assistant

## 2019-06-02 MED ORDER — ALPRAZOLAM 0.5 MG PO TABS: 0.5000 mg | ORAL_TABLET | Freq: Three times a day (TID) | ORAL | 0 refills | Status: DC | PRN

## 2019-06-02 NOTE — Telephone Encounter (Signed)
Sent 1 prescription, please keep

## 2019-06-02 NOTE — Telephone Encounter (Signed)
Spoke with Thea Gist with the [facility] they did receive the rx on 05/24/2019,  but on their end it showed we canceled it.  So we would have to sen in new RX please advise.

## 2019-06-02 NOTE — Telephone Encounter (Signed)
Patient aware and verbalized understanding. °

## 2019-06-02 NOTE — Telephone Encounter (Signed)
I do not see where it was cancelled. It was sent on 2/17.21. can we check with pharmacy to see what has happened?

## 2019-06-02 NOTE — Telephone Encounter (Signed)
ALPRAZolam (XANAX) 0.5 MG tablet 90 tablet 0 05/24/2019   Take 1 tablet (0.5 mg total) by mouth 3 (three) times daily. - Oral  Discontinued Medications   Reason for Discontinue   ALPRAZolam (XANAX) 0.5 MG tablet   Reorder  Orders  Please advise looks like a nmonth supply was sent in but then cancelled but no one called patient and told them anything. Patient had follow up 06/14/19. Please advise so we can call husband and let him know what is going on>?

## 2019-06-13 ENCOUNTER — Telehealth: Payer: Self-pay | Admitting: Physician Assistant

## 2019-06-13 MED ORDER — ATORVASTATIN CALCIUM 20 MG PO TABS: 20.0000 mg | ORAL_TABLET | Freq: Every day | ORAL | 0 refills | Status: DC

## 2019-06-13 MED ORDER — LEVOTHYROXINE SODIUM 88 MCG PO TABS: 88.0000 ug | ORAL_TABLET | Freq: Every day | ORAL | 0 refills | Status: DC

## 2019-06-13 NOTE — Telephone Encounter (Signed)
Patient aware and verbalized understanding. °

## 2019-06-13 NOTE — Telephone Encounter (Signed)
Again, as per Dr Dettinger's request yesterday.  ANY controlled substance will REQUIRE a visit even if it is with a back up provider.  Legally, ALL controlled substances require a face to face visit with a provider if they have never prescribed said substance to the patient previously.  OK to put her on my schedule as a VIDEO visit if needed for refills on this.

## 2019-06-13 NOTE — Telephone Encounter (Signed)
   Medication Request  06/13/2019  What is the name of the medication?  atorvastatin (LIPITOR) 20 MG tablet levothyroxine (SYNTHROID) 88 MCG tablet pregabalin (LYRICA) 200 MG capsule  Have you contacted your pharmacy to request a refill? no  Which pharmacy would you like this sent to? [facility]    Patient notified that their request is being sent to the clinical staff for review and that they should receive a call once it is complete. If they do not receive a call within 24 hours they can check with their pharmacy or our office.   Glenard Haring not here this week for pt apt. She needs refills till next apt.

## 2019-06-13 NOTE — Telephone Encounter (Signed)
ATORVASTATIN AND LEVOTHYROXINE SENT TO PHARMACY AS REQUESTED.   LYRICA WILL REQUIRE PROVIDER APPROVAL  PATIENT LAST SEEN LAST MONTH IN OFFICE

## 2019-06-14 ENCOUNTER — Ambulatory Visit: Payer: Self-pay | Admitting: Physician Assistant

## 2019-06-27 ENCOUNTER — Ambulatory Visit: Payer: Self-pay | Admitting: Physician Assistant

## 2019-06-29 ENCOUNTER — Other Ambulatory Visit: Payer: Self-pay

## 2019-06-29 ENCOUNTER — Encounter: Payer: Self-pay | Admitting: Family

## 2019-06-29 ENCOUNTER — Ambulatory Visit (INDEPENDENT_AMBULATORY_CARE_PROVIDER_SITE_OTHER): Payer: Medicare HMO | Admitting: Family

## 2019-06-29 VITALS — BP 157/85 | HR 51 | Temp 96.4°F | Ht 63.0 in | Wt 187.0 lb

## 2019-06-29 DIAGNOSIS — E559 Vitamin D deficiency, unspecified: Secondary | ICD-10-CM

## 2019-06-29 DIAGNOSIS — I10 Essential (primary) hypertension: Secondary | ICD-10-CM

## 2019-06-29 DIAGNOSIS — E039 Hypothyroidism, unspecified: Secondary | ICD-10-CM | POA: Diagnosis not present

## 2019-06-29 DIAGNOSIS — F419 Anxiety disorder, unspecified: Secondary | ICD-10-CM | POA: Diagnosis not present

## 2019-06-29 DIAGNOSIS — L409 Psoriasis, unspecified: Secondary | ICD-10-CM

## 2019-06-29 DIAGNOSIS — F3341 Major depressive disorder, recurrent, in partial remission: Secondary | ICD-10-CM | POA: Diagnosis not present

## 2019-06-29 DIAGNOSIS — E785 Hyperlipidemia, unspecified: Secondary | ICD-10-CM

## 2019-06-29 DIAGNOSIS — M159 Polyosteoarthritis, unspecified: Secondary | ICD-10-CM

## 2019-06-29 MED ORDER — PREGABALIN 200 MG PO CAPS: 200.0000 mg | ORAL_CAPSULE | Freq: Two times a day (BID) | ORAL | 2 refills | Status: AC

## 2019-06-29 MED ORDER — FUROSEMIDE 20 MG PO TABS: ORAL_TABLET | ORAL | 2 refills | Status: DC

## 2019-06-29 MED ORDER — ATORVASTATIN CALCIUM 20 MG PO TABS: 20.0000 mg | ORAL_TABLET | Freq: Every day | ORAL | 2 refills | Status: DC

## 2019-06-29 MED ORDER — CELECOXIB 100 MG PO CAPS: 100.0000 mg | ORAL_CAPSULE | Freq: Every day | ORAL | 2 refills | Status: DC

## 2019-06-29 MED ORDER — SERTRALINE HCL 100 MG PO TABS: 100.0000 mg | ORAL_TABLET | Freq: Every day | ORAL | 1 refills | Status: AC

## 2019-06-29 MED ORDER — BUSPIRONE HCL 5 MG PO TABS: 5.0000 mg | ORAL_TABLET | Freq: Three times a day (TID) | ORAL | 2 refills | Status: DC

## 2019-06-29 MED ORDER — ALPRAZOLAM 0.5 MG PO TABS: 0.5000 mg | ORAL_TABLET | Freq: Three times a day (TID) | ORAL | 1 refills | Status: DC | PRN

## 2019-06-29 NOTE — Patient Instructions (Signed)

## 2019-06-29 NOTE — Progress Notes (Signed)
Subjective:    Patient ID: Mary Marks, female    DOB: 1935-05-25, 84 y.o.   MRN: [id number]  Chief Complaint  Patient presents with  . Medical Management of Chronic Issues   Pt presents to the office today for chronic follow. She is followed by Dermatologists annually for Psoriasis.  Hypertension This is a chronic problem. The current episode started more than 1 year ago. The problem has been waxing and waning since onset. The problem is uncontrolled. Associated symptoms include anxiety, malaise/fatigue and palpitations. Pertinent negatives include no peripheral edema or shortness of breath. Risk factors for coronary artery disease include dyslipidemia, obesity and sedentary lifestyle. Identifiable causes of hypertension include a thyroid problem.  Hyperlipidemia This is a chronic problem. The current episode started more than 1 year ago. The problem is uncontrolled. Recent lipid tests were reviewed and are high. Exacerbating diseases include obesity. Pertinent negatives include no shortness of breath. Current antihyperlipidemic treatment includes statins. The current treatment provides moderate improvement of lipids. Risk factors for coronary artery disease include dyslipidemia, hypertension and a sedentary lifestyle.  Arthritis Presents for follow-up visit. She complains of pain and stiffness. The symptoms have been stable. Affected locations include the right knee and left knee. Her pain is at a severity of 5/10. Associated symptoms include fatigue.  Thyroid Problem Presents for follow-up visit. Symptoms include anxiety, constipation, depressed mood, fatigue, hoarse voice and palpitations. The symptoms have been stable. Her past medical history is significant for hyperlipidemia.  Depression        This is a chronic problem.  The current episode started more than 1 year ago.   The onset quality is gradual.   The problem occurs intermittently.  The problem has been waxing and waning since onset.   Associated symptoms include fatigue, insomnia, irritable, restlessness, decreased interest and sad.  Past treatments include SSRIs - Selective serotonin reuptake inhibitors.  Compliance with treatment is good.  Past medical history includes thyroid problem and anxiety.   Anxiety Presents for follow-up visit. Symptoms include depressed mood, excessive worry, insomnia, irritability, nervous/anxious behavior, palpitations, panic and restlessness. Patient reports no shortness of breath. The severity of symptoms is moderate.        Review of Systems  Constitutional: Positive for fatigue, irritability and malaise/fatigue.  HENT: Positive for hoarse voice.   Respiratory: Negative for shortness of breath.   Cardiovascular: Positive for palpitations.  Gastrointestinal: Positive for constipation.  Musculoskeletal: Positive for arthritis and stiffness.  Psychiatric/Behavioral: Positive for depression. The patient is nervous/anxious and has insomnia.   All other systems reviewed and are negative.      Objective:   Physical Exam Vitals reviewed.  Constitutional:      General: She is irritable. She is not in acute distress.    Appearance: She is well-developed.  HENT:     Head: Normocephalic and atraumatic.     Right Ear: Tympanic membrane normal.     Left Ear: Tympanic membrane normal.  Eyes:     Pupils: Pupils are equal, round, and reactive to light.  Neck:     Thyroid: No thyromegaly.  Cardiovascular:     Rate and Rhythm: Normal rate and regular rhythm.     Heart sounds: Normal heart sounds. No murmur.  Pulmonary:     Effort: Pulmonary effort is normal. No respiratory distress.     Breath sounds: Normal breath sounds. No wheezing.  Abdominal:     General: Bowel sounds are normal. There is no distension.  Palpations: Abdomen is soft.     Tenderness: There is no abdominal tenderness.  Musculoskeletal:        General: No tenderness.     Cervical back: Normal range of motion and  neck supple.     Comments: Generalized weakness  Skin:    General: Skin is warm and dry.  Neurological:     Mental Status: She is alert and oriented to person, place, and time.     Cranial Nerves: No cranial nerve deficit.     Deep Tendon Reflexes: Reflexes are normal and symmetric.  Psychiatric:        Behavior: Behavior normal.        Thought Content: Thought content normal.        Judgment: Judgment normal.       BP (!) 157/85   Pulse (!) 51   Temp (!) 96.4 F (35.8 C) (Temporal)   Ht 5\' 3"  (1.6 m)   Wt 187 lb (84.8 kg)   SpO2 94%   BMI 33.13 kg/m      Assessment & Plan:  Sena Dunfee comes in today with chief complaint of Medical Management of Chronic Issues   Diagnosis and orders addressed:  1. Essential hypertension Pt checks BP at home and stable  2. Hypothyroidism, unspecified type  3. Anxiety Will continue xanax TID and will add Buspar 5 mg TID Stress management  Pt reviewed in [place] controlled database- No red flags noted, contract and urine drug - ALPRAZolam (XANAX) 0.5 MG tablet; Take 1 tablet (0.5 mg total) by mouth 3 (three) times daily as needed for anxiety.  Dispense: 90 tablet; Refill: 1 - furosemide (LASIX) 20 MG tablet; furosemide 20 mg tablet  Dispense: 30 tablet; Refill: 2 - sertraline (ZOLOFT) 100 MG tablet; Take 1 tablet (100 mg total) by mouth daily.  Dispense: 90 tablet; Refill: 1 - busPIRone (BUSPAR) 5 MG tablet; Take 1 tablet (5 mg total) by mouth 3 (three) times daily.  Dispense: 90 tablet; Refill: 2  4. Recurrent major depressive disorder, in partial remission (HCC) - furosemide (LASIX) 20 MG tablet; furosemide 20 mg tablet  Dispense: 30 tablet; Refill: 2 - sertraline (ZOLOFT) 100 MG tablet; Take 1 tablet (100 mg total) by mouth daily.  Dispense: 90 tablet; Refill: 1 - busPIRone (BUSPAR) 5 MG tablet; Take 1 tablet (5 mg total) by mouth 3 (three) times daily.  Dispense: 90 tablet; Refill: 2  5. Hyperlipidemia, unspecified hyperlipidemia  type - atorvastatin (LIPITOR) 20 MG tablet; Take 1 tablet (20 mg total) by mouth daily.  Dispense: 90 tablet; Refill: 2  6. Vitamin D deficiency  7. Psoriasis - pregabalin (LYRICA) 200 MG capsule; Take 1 capsule (200 mg total) by mouth 2 (two) times daily.  Dispense: 180 capsule; Refill: 2  8. Osteoarthritis of multiple joints, unspecified osteoarthritis type - celecoxib (CELEBREX) 100 MG capsule; Take 1 capsule (100 mg total) by mouth daily.  Dispense: 90 capsule; Refill: 2   Labs pending Health Maintenance reviewed Diet and exercise encouraged  Follow up plan: 2 months   Evelina Dun, FNP

## 2019-07-03 LAB — TOXASSURE SELECT 13 (MW), URINE

## 2019-07-04 ENCOUNTER — Telehealth: Payer: Self-pay | Admitting: *Deleted

## 2019-07-04 DIAGNOSIS — F419 Anxiety disorder, unspecified: Secondary | ICD-10-CM

## 2019-07-04 DIAGNOSIS — F3341 Major depressive disorder, recurrent, in partial remission: Secondary | ICD-10-CM

## 2019-07-04 MED ORDER — FUROSEMIDE 20 MG PO TABS: 20.0000 mg | ORAL_TABLET | Freq: Every day | ORAL | 2 refills | Status: DC

## 2019-07-04 NOTE — Telephone Encounter (Signed)
Fax from [facility] Furosemide 20 mg Please clarify sig

## 2019-07-04 NOTE — Addendum Note (Signed)
Addended by: Evelina Dun A on: 07/04/2019 01:49 PM   Modules accepted: Orders

## 2019-07-04 NOTE — Telephone Encounter (Signed)
Prescription sent to pharmacy.

## 2019-07-05 ENCOUNTER — Other Ambulatory Visit: Payer: Self-pay | Admitting: *Deleted

## 2019-07-05 NOTE — Telephone Encounter (Signed)
Fax from [facility] for Metoprolol 100 mg - historical med Fenofibrate 160 mg - not on med list Last OV 06/29/19 Next OV 08/29/19

## 2019-07-12 NOTE — Telephone Encounter (Signed)
Can we call patient and see if she takes Metoprolol 100 mg?   Please verify if is she is taking  Fenofibrate 160 mg, if so please have her stop this as she no longer needs this.

## 2019-07-26 ENCOUNTER — Encounter: Payer: Self-pay | Admitting: *Deleted

## 2019-07-26 ENCOUNTER — Other Ambulatory Visit: Payer: Self-pay

## 2019-07-26 ENCOUNTER — Other Ambulatory Visit: Payer: Self-pay | Admitting: *Deleted

## 2019-07-26 ENCOUNTER — Encounter: Payer: Self-pay | Admitting: Dermatology

## 2019-07-26 ENCOUNTER — Ambulatory Visit: Payer: Medicare HMO | Admitting: Dermatology

## 2019-07-26 DIAGNOSIS — Z85828 Personal history of other malignant neoplasm of skin: Secondary | ICD-10-CM

## 2019-07-26 DIAGNOSIS — D485 Neoplasm of uncertain behavior of skin: Secondary | ICD-10-CM | POA: Diagnosis not present

## 2019-07-26 DIAGNOSIS — C4491 Basal cell carcinoma of skin, unspecified: Secondary | ICD-10-CM

## 2019-07-26 DIAGNOSIS — L409 Psoriasis, unspecified: Secondary | ICD-10-CM | POA: Diagnosis not present

## 2019-07-26 DIAGNOSIS — E785 Hyperlipidemia, unspecified: Secondary | ICD-10-CM

## 2019-07-26 HISTORY — DX: Basal cell carcinoma of skin, unspecified: C44.91

## 2019-07-26 MED ORDER — LEVOTHYROXINE SODIUM 88 MCG PO TABS: 88.0000 ug | ORAL_TABLET | Freq: Every day | ORAL | 2 refills | Status: DC

## 2019-07-26 NOTE — Patient Instructions (Signed)

## 2019-07-28 ENCOUNTER — Other Ambulatory Visit: Payer: Self-pay | Admitting: *Deleted

## 2019-07-28 DIAGNOSIS — L400 Psoriasis vulgaris: Secondary | ICD-10-CM

## 2019-07-28 MED ORDER — TALTZ 80 MG/ML ~~LOC~~ SOSY: 80.0000 mg | PREFILLED_SYRINGE | SUBCUTANEOUS | 11 refills | Status: DC

## 2019-07-29 ENCOUNTER — Encounter: Payer: Self-pay | Admitting: Dermatology

## 2019-07-31 ENCOUNTER — Telehealth: Payer: Self-pay

## 2019-07-31 NOTE — Telephone Encounter (Signed)
-----   Message from Lavonna Monarch, MD sent at 07/28/2019  7:03 PM EDT ----- Schedule surgery with Dr. Darene Lamer

## 2019-07-31 NOTE — Telephone Encounter (Signed)
Phone call to patient with her Pathology results.  Patient aware of results, appointment scheduled with Dr. Denna Haggard on 08/24/19  @ 2:30pm.

## 2019-07-31 NOTE — Telephone Encounter (Signed)
Phone call to patient with her Pathology results.  Message left for patient to give the office a call back.

## 2019-08-21 ENCOUNTER — Other Ambulatory Visit: Payer: Self-pay | Admitting: *Deleted

## 2019-08-21 DIAGNOSIS — F419 Anxiety disorder, unspecified: Secondary | ICD-10-CM

## 2019-08-23 ENCOUNTER — Other Ambulatory Visit: Payer: Self-pay | Admitting: *Deleted

## 2019-08-23 DIAGNOSIS — F419 Anxiety disorder, unspecified: Secondary | ICD-10-CM

## 2019-08-23 NOTE — Telephone Encounter (Signed)
Fax from [facility] for Fenofibrate 160 mg Not on current med list Please advise

## 2019-08-24 ENCOUNTER — Encounter: Payer: Self-pay | Admitting: Dermatology

## 2019-08-24 ENCOUNTER — Ambulatory Visit (INDEPENDENT_AMBULATORY_CARE_PROVIDER_SITE_OTHER): Payer: Medicare HMO | Admitting: Dermatology

## 2019-08-24 ENCOUNTER — Other Ambulatory Visit: Payer: Self-pay

## 2019-08-24 DIAGNOSIS — C4491 Basal cell carcinoma of skin, unspecified: Secondary | ICD-10-CM

## 2019-08-24 DIAGNOSIS — C44519 Basal cell carcinoma of skin of other part of trunk: Secondary | ICD-10-CM | POA: Diagnosis not present

## 2019-08-24 NOTE — Patient Instructions (Signed)

## 2019-08-25 ENCOUNTER — Other Ambulatory Visit: Payer: Self-pay | Admitting: *Deleted

## 2019-08-25 DIAGNOSIS — F3341 Major depressive disorder, recurrent, in partial remission: Secondary | ICD-10-CM

## 2019-08-25 DIAGNOSIS — F419 Anxiety disorder, unspecified: Secondary | ICD-10-CM

## 2019-08-25 MED ORDER — FUROSEMIDE 20 MG PO TABS: 20.0000 mg | ORAL_TABLET | Freq: Every day | ORAL | 0 refills | Status: DC

## 2019-08-25 NOTE — Addendum Note (Signed)
Addended by: Ladean Raya on: 08/25/2019 02:19 PM   Modules accepted: Orders

## 2019-08-25 NOTE — Telephone Encounter (Signed)
Fax from [facility] refill Fenofibrate 160 mg Not on current med list Last OV 05/12/19 Next Ov 08/29/19

## 2019-08-28 ENCOUNTER — Other Ambulatory Visit: Payer: Self-pay | Admitting: *Deleted

## 2019-08-28 ENCOUNTER — Encounter: Payer: Self-pay | Admitting: Dermatology

## 2019-08-28 NOTE — Progress Notes (Signed)
   Follow-Up Visit   Subjective  Adaliz Laraia is a 84 y.o. female who presents for the following: Procedure (bcc right lower back).  BCC Location: Right lower back Duration:  Quality:  Associated Signs/Symptoms: Modifying Factors:  Severity:  Timing: Context: For treatment  The following portions of the chart were reviewed this encounter and updated as appropriate:     Objective  Well appearing patient in no apparent distress; mood and affect are within normal limits.  A focused examination was performed including Back. Relevant physical exam findings are noted in the Assessment and Plan.   Assessment & Plan  Basal cell carcinoma (BCC), unspecified site Right Lower Back  Destruction of lesion Complexity: simple   Destruction method: electrodesiccation and curettage   Informed consent: discussed and consent obtained   Timeout:  patient name, date of birth, surgical site, and procedure verified Anesthesia: the lesion was anesthetized in a standard fashion   Anesthetic:  1% lidocaine w/ epinephrine 1-100,000 local infiltration Curettage performed in three different directions: Yes   Curettage cycles:  3 Lesion length (cm):  1.4 Lesion width (cm):  1 Margin per side (cm):  0 Final wound size (cm):  1.4 Hemostasis achieved with:  ferric subsulfate Outcome: patient tolerated procedure well with no complications   Post-procedure details: wound care instructions given   Additional details:  Inoculated with parenteral 5% fluorouracil 1.4cm

## 2019-08-28 NOTE — Telephone Encounter (Signed)
Fax from [facility] refill Fenofibrate 160 mg Not on current med list Last OV 05/12/19 Next Ov 08/29/19

## 2019-08-29 ENCOUNTER — Ambulatory Visit (INDEPENDENT_AMBULATORY_CARE_PROVIDER_SITE_OTHER): Payer: Medicare HMO | Admitting: Family

## 2019-08-29 ENCOUNTER — Other Ambulatory Visit: Payer: Self-pay

## 2019-08-29 ENCOUNTER — Encounter: Payer: Self-pay | Admitting: Family

## 2019-08-29 VITALS — BP 153/74 | HR 50 | Temp 97.5°F | Ht 63.0 in | Wt 196.2 lb

## 2019-08-29 DIAGNOSIS — F3341 Major depressive disorder, recurrent, in partial remission: Secondary | ICD-10-CM

## 2019-08-29 DIAGNOSIS — R531 Weakness: Secondary | ICD-10-CM

## 2019-08-29 DIAGNOSIS — F419 Anxiety disorder, unspecified: Secondary | ICD-10-CM

## 2019-08-29 DIAGNOSIS — I10 Essential (primary) hypertension: Secondary | ICD-10-CM

## 2019-08-29 DIAGNOSIS — M159 Polyosteoarthritis, unspecified: Secondary | ICD-10-CM | POA: Diagnosis not present

## 2019-08-29 MED ORDER — CELECOXIB 100 MG PO CAPS: 100.0000 mg | ORAL_CAPSULE | Freq: Two times a day (BID) | ORAL | 2 refills | Status: DC

## 2019-08-29 NOTE — Patient Instructions (Signed)

## 2019-08-29 NOTE — Telephone Encounter (Signed)
No longer on this medication

## 2019-08-29 NOTE — Progress Notes (Signed)
Subjective:    Patient ID: Mary Marks, female    DOB: 10/09/1935, 84 y.o.   MRN: [id number]  Chief Complaint  Patient presents with  . Hypertension    two month recheck   Pt presents to the office today to recheck HTN and GAD. She reports her anxiety has improved since starting the Buspar. However, she is caring her her husband who has cancer and does not feel that she can decrease her xanax a this time.   She also reports generalized weakness and would like PT to work with her.  Hypertension This is a chronic problem. The current episode started more than 1 year ago. The problem has been waxing and waning since onset. The problem is controlled. Associated symptoms include anxiety, malaise/fatigue and peripheral edema. Pertinent negatives include no shortness of breath. The current treatment provides mild improvement. There is no history of kidney disease, CAD/MI or heart failure.  Anxiety Presents for follow-up visit. Symptoms include depressed mood, excessive worry, irritability, nervous/anxious behavior and panic. Patient reports no shortness of breath.    Depression        This is a chronic problem.  The current episode started more than 1 year ago.   The problem occurs intermittently.  Past medical history includes anxiety.       Review of Systems  Constitutional: Positive for irritability and malaise/fatigue.  Respiratory: Negative for shortness of breath.   Neurological: Positive for weakness.  Psychiatric/Behavioral: Positive for depression. The patient is nervous/anxious.   All other systems reviewed and are negative.      Objective:   Physical Exam Vitals reviewed.  Constitutional:      General: She is not in acute distress.    Appearance: She is well-developed.  HENT:     Head: Normocephalic and atraumatic.     Right Ear: Tympanic membrane normal.     Left Ear: Tympanic membrane normal.  Eyes:     Pupils: Pupils are equal, round, and reactive to light.  Neck:      Thyroid: No thyromegaly.  Cardiovascular:     Rate and Rhythm: Normal rate and regular rhythm.     Heart sounds: Normal heart sounds. No murmur.  Pulmonary:     Effort: Pulmonary effort is normal. No respiratory distress.     Breath sounds: Normal breath sounds. No wheezing.  Abdominal:     General: Bowel sounds are normal. There is no distension.     Palpations: Abdomen is soft.     Tenderness: There is no abdominal tenderness.  Musculoskeletal:        General: No tenderness.     Cervical back: Normal range of motion and neck supple.  Skin:    General: Skin is warm and dry.  Neurological:     Mental Status: She is alert and oriented to person, place, and time.     Cranial Nerves: No cranial nerve deficit.     Motor: Weakness present.     Gait: Gait abnormal.     Deep Tendon Reflexes: Reflexes are normal and symmetric.     Comments: Using cane to walk  Psychiatric:        Behavior: Behavior normal.        Thought Content: Thought content normal.        Judgment: Judgment normal.       BP (!) 153/74   Pulse (!) 50   Temp (!) 97.5 F (36.4 C) (Temporal)   Ht 5\' 3"  (1.6 m)  Wt 196 lb 3.2 oz (89 kg)   SpO2 93%   BMI 34.76 kg/m      Assessment & Plan:  Mary Marks comes in today with chief complaint of Hypertension (two month recheck)   Diagnosis and orders addressed:  1. Osteoarthritis of multiple joints, unspecified osteoarthritis type Will increase celebrex to BID - celecoxib (CELEBREX) 100 MG capsule; Take 1 capsule (100 mg total) by mouth 2 (two) times daily.  Dispense: 180 capsule; Refill: 2 - Ambulatory referral to Home Health  2. Weakness - Ambulatory referral to Home Health  3. Essential hypertension  4. Anxiety  5. Recurrent major depressive disorder, in partial remission ([place])    Labs pending Health Maintenance reviewed Diet and exercise encouraged  Follow up plan: 3 months    Evelina Dun, FNP

## 2019-09-02 ENCOUNTER — Other Ambulatory Visit: Payer: Self-pay | Admitting: Family

## 2019-09-02 DIAGNOSIS — F419 Anxiety disorder, unspecified: Secondary | ICD-10-CM

## 2019-09-02 DIAGNOSIS — F3341 Major depressive disorder, recurrent, in partial remission: Secondary | ICD-10-CM

## 2019-09-06 ENCOUNTER — Telehealth: Payer: Self-pay | Admitting: Family

## 2019-09-06 ENCOUNTER — Other Ambulatory Visit: Payer: Self-pay | Admitting: Family

## 2019-09-06 DIAGNOSIS — F419 Anxiety disorder, unspecified: Secondary | ICD-10-CM

## 2019-09-06 NOTE — Telephone Encounter (Signed)
°  Prescription Request  09/06/2019  What is the name of the medication or equipment? ALPRAZolam (XANAX) 0.5 MG tablet    Have you contacted your pharmacy to request a refill? (if applicable) no but pt was seen 08/29/2019. does she ntbs again? Please call back   Which pharmacy would you like this sent to? [facility]   Patient notified that their request is being sent to the clinical staff for review and that they should receive a response within 2 business days.

## 2019-09-07 ENCOUNTER — Ambulatory Visit: Payer: Medicare HMO | Admitting: Family

## 2019-09-07 NOTE — Telephone Encounter (Signed)
CALLED PATIENT EXPLAINED XANAX HAD TO BE REFILLED IN OFFICE SHE SAID SHE ASSUMED [facility] REFILLED WHILE SHE WAS HERE. ALSO EXPLAINED THIS WAS A DANGEROUS MEDICATION SHE NOT BE TAKEN BUT AS NEEDED. SHE STATES SHE HAS TAKING IT EVERYDAY FOR 10 YEARS.  TOLD HER I WOULD HAVE TO FORWARD THE MESSAGE TO [facility]

## 2019-09-08 MED ORDER — ALPRAZOLAM 0.5 MG PO TABS: 0.5000 mg | ORAL_TABLET | Freq: Two times a day (BID) | ORAL | 3 refills | Status: DC | PRN

## 2019-09-08 NOTE — Telephone Encounter (Signed)
Patient aware, script is ready. 

## 2019-09-08 NOTE — Telephone Encounter (Signed)
Prescription sent to pharmacy.

## 2019-09-16 NOTE — Progress Notes (Addendum)
   Follow-Up Visit   Subjective  Mary Marks is a 84 y.o. female who presents for the following: Annual Exam (left eye lid new dark history of mm per patient). Growth Location: Left eyelid Duration:  Quality:  Associated Signs/Symptoms: Modifying Factors:  Severity:  Timing: Context: History of skin cancers  The following portions of the chart were reviewed this encounter and updated as appropriate: Tobacco  Allergies  Meds  Problems  Med Hx  Surg Hx  Fam Hx      Objective  Well appearing patient in no apparent distress; mood and affect are within normal limits.  All sun exposed areas plus back examined.   Assessment & Plan  Neoplasm of uncertain behavior of skin (3) Right Lower Back  Skin / nail biopsy Type of biopsy: tangential   Informed consent: discussed and consent obtained   Timeout: patient name, date of birth, surgical site, and procedure verified   Procedure prep:  Patient was prepped and draped in usual sterile fashion Prep type:  Chlorhexidine Anesthesia: the lesion was anesthetized in a standard fashion   Anesthetic:  1% lidocaine w/ epinephrine 1-100,000 local infiltration Instrument used: flexible razor blade   Hemostasis achieved with: ferric subsulfate   Outcome: patient tolerated procedure well   Post-procedure details: sterile dressing applied and wound care instructions given   Dressing type: petrolatum   Additional details:  Patient identified lesion of concern.  Lesion identified by physician.  Specimen 1 - Surgical pathology Differential Diagnosis: bcc vs scc Check Margins: No  Left Supraorbital Region  Skin / nail biopsy Type of biopsy: tangential   Informed consent: discussed and consent obtained   Timeout: patient name, date of birth, surgical site, and procedure verified   Procedure prep:  Patient was prepped and draped in usual sterile fashion Prep type:  Chlorhexidine Anesthesia: the lesion was anesthetized in a standard  fashion   Anesthetic:  1% lidocaine w/ epinephrine 1-100,000 local infiltration Instrument used: flexible razor blade   Hemostasis achieved with: ferric subsulfate   Outcome: patient tolerated procedure well   Post-procedure details: sterile dressing applied and wound care instructions given   Dressing type: petrolatum   Additional details:  Patient identified lesion of concern.  Lesion identified by physician.  Specimen 2 - Surgical pathology Differential Diagnosis: r/o atypia Check Margins: No  Mid Lower Cutaneous Lip  Skin / nail biopsy Type of biopsy: tangential   Informed consent: discussed and consent obtained   Timeout: patient name, date of birth, surgical site, and procedure verified   Procedure prep:  Patient was prepped and draped in usual sterile fashion Prep type:  Chlorhexidine Anesthesia: the lesion was anesthetized in a standard fashion   Anesthetic:  1% lidocaine w/ epinephrine 1-100,000 local infiltration Instrument used: flexible razor blade   Hemostasis achieved with: ferric subsulfate   Outcome: patient tolerated procedure well   Post-procedure details: sterile dressing applied and wound care instructions given   Dressing type: petrolatum   Additional details:  Patient identified lesion of concern.  Lesion identified by physician.  Specimen 3 - Surgical pathology Differential Diagnosis: bcc vs scc Check Margins: No  Psoriasis (4) Left Elbow - Posterior; Right Elbow - Posterior; Right Knee - Anterior; Neck - Posterior  Continue Taltz  Other Related Medications pregabalin (LYRICA) 200 MG capsule

## 2019-10-05 ENCOUNTER — Telehealth: Payer: Self-pay | Admitting: Family

## 2019-10-05 NOTE — Telephone Encounter (Signed)
FYI: Tom called from [facility] to let pts PCP know that pt has quarantined herself because her husband tested positive for COVID. Because of this, Gershon Mussel is not able to see pt and has marked her as missed visit for the week.

## 2019-10-17 ENCOUNTER — Ambulatory Visit (INDEPENDENT_AMBULATORY_CARE_PROVIDER_SITE_OTHER): Payer: Medicare HMO

## 2019-10-17 ENCOUNTER — Other Ambulatory Visit: Payer: Self-pay

## 2019-10-17 DIAGNOSIS — M1712 Unilateral primary osteoarthritis, left knee: Secondary | ICD-10-CM

## 2019-10-17 DIAGNOSIS — L409 Psoriasis, unspecified: Secondary | ICD-10-CM

## 2019-10-17 DIAGNOSIS — E785 Hyperlipidemia, unspecified: Secondary | ICD-10-CM

## 2019-10-17 DIAGNOSIS — I10 Essential (primary) hypertension: Secondary | ICD-10-CM

## 2019-10-17 DIAGNOSIS — S62102D Fracture of unspecified carpal bone, left wrist, subsequent encounter for fracture with routine healing: Secondary | ICD-10-CM | POA: Diagnosis not present

## 2019-10-17 DIAGNOSIS — E039 Hypothyroidism, unspecified: Secondary | ICD-10-CM

## 2019-10-17 DIAGNOSIS — I2510 Atherosclerotic heart disease of native coronary artery without angina pectoris: Secondary | ICD-10-CM

## 2019-10-17 DIAGNOSIS — F3341 Major depressive disorder, recurrent, in partial remission: Secondary | ICD-10-CM

## 2019-10-17 DIAGNOSIS — F411 Generalized anxiety disorder: Secondary | ICD-10-CM | POA: Diagnosis not present

## 2019-10-17 DIAGNOSIS — M5136 Other intervertebral disc degeneration, lumbar region: Secondary | ICD-10-CM | POA: Diagnosis not present

## 2019-10-17 DIAGNOSIS — G8929 Other chronic pain: Secondary | ICD-10-CM

## 2019-10-17 DIAGNOSIS — E559 Vitamin D deficiency, unspecified: Secondary | ICD-10-CM

## 2019-10-17 DIAGNOSIS — Z9181 History of falling: Secondary | ICD-10-CM

## 2019-11-01 ENCOUNTER — Other Ambulatory Visit: Payer: Self-pay | Admitting: *Deleted

## 2019-11-01 DIAGNOSIS — F419 Anxiety disorder, unspecified: Secondary | ICD-10-CM

## 2019-11-01 NOTE — Telephone Encounter (Signed)
Fax from [facility] request for Fluticasone 50 mcg/act nasal spray Lisinopril 10 mg Both brought over from [facility]

## 2019-11-02 ENCOUNTER — Telehealth: Payer: Self-pay | Admitting: Family

## 2019-11-02 NOTE — Telephone Encounter (Signed)
Pt said she was getting down from bed and couldn't get back onto it. Didn't really have a fall, was from trying to find something to pull up on to get back into bed. She has the area under her breasts wrapped and it is feeling better, is not wanting to be seen at this time.

## 2019-11-03 MED ORDER — LISINOPRIL 10 MG PO TABS: 10.0000 mg | ORAL_TABLET | Freq: Every day | ORAL | 1 refills | Status: DC

## 2019-11-03 MED ORDER — FLUTICASONE PROPIONATE 50 MCG/ACT NA SUSP: 1.0000 | Freq: Every day | NASAL | 3 refills | Status: DC

## 2019-11-03 NOTE — Addendum Note (Signed)
Addended by: Antonietta Barcelona D on: 11/03/2019 09:15 AM   Modules accepted: Orders

## 2019-11-06 ENCOUNTER — Other Ambulatory Visit: Payer: Self-pay | Admitting: Family

## 2019-11-06 DIAGNOSIS — F3341 Major depressive disorder, recurrent, in partial remission: Secondary | ICD-10-CM

## 2019-11-06 DIAGNOSIS — F419 Anxiety disorder, unspecified: Secondary | ICD-10-CM

## 2019-11-30 ENCOUNTER — Ambulatory Visit: Payer: Medicare HMO | Admitting: Family

## 2019-12-04 ENCOUNTER — Encounter: Payer: Self-pay | Admitting: Family

## 2019-12-21 DIAGNOSIS — J383 Other diseases of vocal cords: Secondary | ICD-10-CM | POA: Diagnosis not present

## 2019-12-21 DIAGNOSIS — J3801 Paralysis of vocal cords and larynx, unilateral: Secondary | ICD-10-CM | POA: Insufficient documentation

## 2019-12-26 DIAGNOSIS — I10 Essential (primary) hypertension: Secondary | ICD-10-CM | POA: Diagnosis not present

## 2019-12-29 ENCOUNTER — Other Ambulatory Visit: Payer: Self-pay

## 2019-12-29 ENCOUNTER — Ambulatory Visit (INDEPENDENT_AMBULATORY_CARE_PROVIDER_SITE_OTHER): Payer: Medicare HMO

## 2019-12-29 DIAGNOSIS — E785 Hyperlipidemia, unspecified: Secondary | ICD-10-CM

## 2019-12-29 DIAGNOSIS — I2510 Atherosclerotic heart disease of native coronary artery without angina pectoris: Secondary | ICD-10-CM | POA: Diagnosis not present

## 2019-12-29 DIAGNOSIS — M5136 Other intervertebral disc degeneration, lumbar region: Secondary | ICD-10-CM

## 2019-12-29 DIAGNOSIS — E559 Vitamin D deficiency, unspecified: Secondary | ICD-10-CM

## 2019-12-29 DIAGNOSIS — F411 Generalized anxiety disorder: Secondary | ICD-10-CM | POA: Diagnosis not present

## 2019-12-29 DIAGNOSIS — F3341 Major depressive disorder, recurrent, in partial remission: Secondary | ICD-10-CM

## 2019-12-29 DIAGNOSIS — E039 Hypothyroidism, unspecified: Secondary | ICD-10-CM

## 2019-12-29 DIAGNOSIS — I10 Essential (primary) hypertension: Secondary | ICD-10-CM

## 2019-12-29 DIAGNOSIS — S62102D Fracture of unspecified carpal bone, left wrist, subsequent encounter for fracture with routine healing: Secondary | ICD-10-CM

## 2019-12-29 DIAGNOSIS — G8929 Other chronic pain: Secondary | ICD-10-CM | POA: Diagnosis not present

## 2019-12-29 DIAGNOSIS — Z9181 History of falling: Secondary | ICD-10-CM

## 2019-12-29 DIAGNOSIS — M1712 Unilateral primary osteoarthritis, left knee: Secondary | ICD-10-CM

## 2019-12-29 DIAGNOSIS — L409 Psoriasis, unspecified: Secondary | ICD-10-CM

## 2020-01-25 DIAGNOSIS — I10 Essential (primary) hypertension: Secondary | ICD-10-CM | POA: Diagnosis not present

## 2020-01-25 DIAGNOSIS — F419 Anxiety disorder, unspecified: Secondary | ICD-10-CM | POA: Diagnosis not present

## 2020-01-25 DIAGNOSIS — J309 Allergic rhinitis, unspecified: Secondary | ICD-10-CM | POA: Diagnosis not present

## 2020-02-26 ENCOUNTER — Ambulatory Visit: Payer: Medicare HMO | Admitting: Dermatology

## 2020-03-19 ENCOUNTER — Encounter: Payer: Self-pay | Admitting: Dermatology

## 2020-03-19 ENCOUNTER — Ambulatory Visit (INDEPENDENT_AMBULATORY_CARE_PROVIDER_SITE_OTHER): Payer: Medicare HMO | Admitting: Dermatology

## 2020-03-19 ENCOUNTER — Other Ambulatory Visit: Payer: Self-pay

## 2020-03-19 DIAGNOSIS — Z85820 Personal history of malignant melanoma of skin: Secondary | ICD-10-CM | POA: Diagnosis not present

## 2020-03-19 DIAGNOSIS — Z79899 Other long term (current) drug therapy: Secondary | ICD-10-CM | POA: Diagnosis not present

## 2020-03-19 DIAGNOSIS — L400 Psoriasis vulgaris: Secondary | ICD-10-CM

## 2020-03-19 DIAGNOSIS — L729 Follicular cyst of the skin and subcutaneous tissue, unspecified: Secondary | ICD-10-CM | POA: Diagnosis not present

## 2020-03-19 MED ORDER — CLOBETASOL PROPIONATE 0.05 % EX SOLN: 1.0000 | Freq: Every day | CUTANEOUS | 1 refills | Status: DC

## 2020-03-20 DIAGNOSIS — L409 Psoriasis, unspecified: Secondary | ICD-10-CM | POA: Diagnosis not present

## 2020-03-22 LAB — QUANTIFERON-TB GOLD PLUS
Mitogen-NIL: >10 IU/mL
NIL: 0.02 IU/mL
QuantiFERON-TB Gold Plus: NEGATIVE
TB1-NIL: 0 IU/mL
TB2-NIL: 0 IU/mL

## 2020-03-23 NOTE — Progress Notes (Signed)
   Follow-Up Visit   Subjective  Mary Marks is a 84 y.o. female who presents for the following: Psoriasis (Follow up for Psoriasis currently on Taltz per patient it's not helping like it did for the first two years. Per patient she does have a flare up now and it's worse on her head.).  Psoriasis Location: Severe flare on scalp Duration:  Quality:  Associated Signs/Symptoms: Modifying Factors: Taltz no longer helping Severity:  Timing: Context: Also history of melanoma left shoulder  Objective  Well appearing patient in no apparent distress; mood and affect are within normal limits. Objective  Right Lower Back: 1.2 cm dermal nodule with central black border.  Objective  Left Shoulder - Posterior: Site clear today..  No regional adenopathy.  No other atypical moles.  Objective  Chest - Medial [facility]), Left Breast, Left Forearm - Posterior, Left Hip (side) - Posterior, Left Lower Leg - Anterior, Left Thigh - Anterior, Left Upper Arm - Posterior, Left Upper Back, Mid Occipital Scalp, Right Abdomen (side) - Upper, Right Breast, Right Forearm - Anterior, Right Forearm - Posterior, Right Hip (side) - Posterior, Right Lower Back, Right Lower Leg - Anterior, Right Thigh - Anterior, Right Upper Arm - Posterior: Thick excoriated inflamed psoriasiform plaques on scalp.  Daughter-in-law with patient in room.  Again emphasized the concept of any injury to the skin (with #1 being picking and scratching) essentially making every psoriasis therapy not beneficial (Koebnerization).   All skin waist up examined.   Assessment & Plan    Cyst of skin Right Lower Back  Benign okay to leave unless patient wants removed.  Personal history of malignant melanoma of skin Left Shoulder - Posterior  Annual skin examination.  Encouraged to self examine her skin twice annually.  Drug therapy  Other Related Procedures QuantiFERON-TB Gold Plus  Plaque psoriasis (18) Left Hip (side) - Posterior;  Right Hip (side) - Posterior; Left Upper Arm - Posterior; Right Upper Arm - Posterior; Right Forearm - Anterior; Left Forearm - Posterior; Right Forearm - Posterior; Left Thigh - Anterior; Right Thigh - Anterior; Left Lower Leg - Anterior; Right Lower Leg - Anterior; Chest - Medial [facility]); Left Breast; Right Breast; Right Abdomen (side) - Upper; Left Upper Back; Right Lower Back; Mid Occipital Scalp  Patient will stop Taltz and we will start the process for Skyrizi.  We will try to get affordable clobetasol solution to use as an interim measure.  Knows not to use this on the face or body folds.  clobetasol (TEMOVATE) 0.05 % external solution - Mid Occipital Scalp  Other Related Procedures QuantiFERON-TB Gold Plus     I, Lavonna Monarch, MD, have reviewed all documentation for this visit.  The documentation on 03/23/20 for the exam, diagnosis, procedures, and orders are all accurate and complete.

## 2020-03-25 ENCOUNTER — Telehealth: Payer: Self-pay | Admitting: *Deleted

## 2020-03-25 NOTE — Telephone Encounter (Signed)
TB QUANT GOLD NEGATIVE 03/20/20

## 2020-03-25 NOTE — Telephone Encounter (Signed)
Faxed over senderra paper work for [place] [employer] new start.

## 2020-04-01 DIAGNOSIS — J3801 Paralysis of vocal cords and larynx, unilateral: Secondary | ICD-10-CM | POA: Diagnosis not present

## 2020-04-01 DIAGNOSIS — R490 Dysphonia: Secondary | ICD-10-CM | POA: Diagnosis not present

## 2020-04-30 DIAGNOSIS — E782 Mixed hyperlipidemia: Secondary | ICD-10-CM | POA: Diagnosis not present

## 2020-04-30 DIAGNOSIS — J309 Allergic rhinitis, unspecified: Secondary | ICD-10-CM | POA: Diagnosis not present

## 2020-04-30 DIAGNOSIS — L409 Psoriasis, unspecified: Secondary | ICD-10-CM | POA: Diagnosis not present

## 2020-04-30 DIAGNOSIS — S6992XA Unspecified injury of left wrist, hand and finger(s), initial encounter: Secondary | ICD-10-CM | POA: Diagnosis not present

## 2020-04-30 DIAGNOSIS — I10 Essential (primary) hypertension: Secondary | ICD-10-CM | POA: Diagnosis not present

## 2020-04-30 DIAGNOSIS — E039 Hypothyroidism, unspecified: Secondary | ICD-10-CM | POA: Diagnosis not present

## 2020-04-30 DIAGNOSIS — F419 Anxiety disorder, unspecified: Secondary | ICD-10-CM | POA: Diagnosis not present

## 2020-05-21 ENCOUNTER — Telehealth: Payer: Self-pay | Admitting: *Deleted

## 2020-05-21 NOTE — Telephone Encounter (Signed)
Checked status of drug via senderra-   I'm showing patient was mailed a patient assistance application on 7/30 due to high copay She did not qualify for a Titanic and there aren't any grants available at this time  Called drug rep- will have rep call to see if there is anything else we can do to help this be affordable for patient.

## 2020-05-21 NOTE — Telephone Encounter (Signed)
Civil Service fast streamer called from [place] [employer]- I informed her that patient copay to high/ can't afford- patient needs to call my abbie assist @ [phone]- tell them she has a Medicare plan , copay to high and request application for assistance program.  Patient states she will call - told patient to let us know if she wants to continue with Skyrizi.

## 2020-05-27 ENCOUNTER — Other Ambulatory Visit: Payer: Self-pay | Admitting: Dermatology

## 2020-05-27 DIAGNOSIS — L400 Psoriasis vulgaris: Secondary | ICD-10-CM

## 2020-06-05 DIAGNOSIS — M25532 Pain in left wrist: Secondary | ICD-10-CM | POA: Diagnosis not present

## 2020-08-15 DIAGNOSIS — R69 Illness, unspecified: Secondary | ICD-10-CM | POA: Diagnosis not present

## 2020-08-21 DIAGNOSIS — F419 Anxiety disorder, unspecified: Secondary | ICD-10-CM | POA: Diagnosis not present

## 2020-08-21 DIAGNOSIS — E782 Mixed hyperlipidemia: Secondary | ICD-10-CM | POA: Diagnosis not present

## 2020-08-21 DIAGNOSIS — J309 Allergic rhinitis, unspecified: Secondary | ICD-10-CM | POA: Diagnosis not present

## 2020-08-21 DIAGNOSIS — E039 Hypothyroidism, unspecified: Secondary | ICD-10-CM | POA: Diagnosis not present

## 2020-08-21 DIAGNOSIS — I10 Essential (primary) hypertension: Secondary | ICD-10-CM | POA: Diagnosis not present

## 2020-08-21 DIAGNOSIS — M1712 Unilateral primary osteoarthritis, left knee: Secondary | ICD-10-CM | POA: Diagnosis not present

## 2020-11-20 DIAGNOSIS — I10 Essential (primary) hypertension: Secondary | ICD-10-CM | POA: Diagnosis not present

## 2020-11-20 DIAGNOSIS — F419 Anxiety disorder, unspecified: Secondary | ICD-10-CM | POA: Diagnosis not present

## 2020-11-20 DIAGNOSIS — M1712 Unilateral primary osteoarthritis, left knee: Secondary | ICD-10-CM | POA: Diagnosis not present

## 2020-11-20 DIAGNOSIS — E039 Hypothyroidism, unspecified: Secondary | ICD-10-CM | POA: Diagnosis not present

## 2020-11-20 DIAGNOSIS — E782 Mixed hyperlipidemia: Secondary | ICD-10-CM | POA: Diagnosis not present

## 2020-11-20 DIAGNOSIS — J309 Allergic rhinitis, unspecified: Secondary | ICD-10-CM | POA: Diagnosis not present

## 2021-02-18 DIAGNOSIS — F419 Anxiety disorder, unspecified: Secondary | ICD-10-CM | POA: Diagnosis not present

## 2021-02-18 DIAGNOSIS — E039 Hypothyroidism, unspecified: Secondary | ICD-10-CM | POA: Diagnosis not present

## 2021-02-18 DIAGNOSIS — J309 Allergic rhinitis, unspecified: Secondary | ICD-10-CM | POA: Diagnosis not present

## 2021-02-18 DIAGNOSIS — E782 Mixed hyperlipidemia: Secondary | ICD-10-CM | POA: Diagnosis not present

## 2021-02-18 DIAGNOSIS — I10 Essential (primary) hypertension: Secondary | ICD-10-CM | POA: Diagnosis not present

## 2021-02-18 DIAGNOSIS — M1712 Unilateral primary osteoarthritis, left knee: Secondary | ICD-10-CM | POA: Diagnosis not present

## 2021-04-08 DIAGNOSIS — R490 Dysphonia: Secondary | ICD-10-CM | POA: Diagnosis not present

## 2021-05-07 DIAGNOSIS — R69 Illness, unspecified: Secondary | ICD-10-CM | POA: Diagnosis not present

## 2021-05-08 DIAGNOSIS — J3801 Paralysis of vocal cords and larynx, unilateral: Secondary | ICD-10-CM | POA: Diagnosis not present

## 2021-05-14 ENCOUNTER — Other Ambulatory Visit: Payer: Self-pay

## 2021-05-14 ENCOUNTER — Ambulatory Visit: Payer: Medicare HMO | Admitting: Dermatology

## 2021-05-14 DIAGNOSIS — D1801 Hemangioma of skin and subcutaneous tissue: Secondary | ICD-10-CM

## 2021-05-14 DIAGNOSIS — L821 Other seborrheic keratosis: Secondary | ICD-10-CM | POA: Diagnosis not present

## 2021-05-14 DIAGNOSIS — L409 Psoriasis, unspecified: Secondary | ICD-10-CM

## 2021-05-14 DIAGNOSIS — L820 Inflamed seborrheic keratosis: Secondary | ICD-10-CM | POA: Diagnosis not present

## 2021-05-14 DIAGNOSIS — Z1283 Encounter for screening for malignant neoplasm of skin: Secondary | ICD-10-CM

## 2021-05-14 DIAGNOSIS — D485 Neoplasm of uncertain behavior of skin: Secondary | ICD-10-CM

## 2021-05-14 NOTE — Patient Instructions (Signed)

## 2021-05-21 ENCOUNTER — Other Ambulatory Visit: Payer: Self-pay | Admitting: Physician Assistant

## 2021-05-21 DIAGNOSIS — F419 Anxiety disorder, unspecified: Secondary | ICD-10-CM | POA: Diagnosis not present

## 2021-05-21 DIAGNOSIS — J309 Allergic rhinitis, unspecified: Secondary | ICD-10-CM | POA: Diagnosis not present

## 2021-05-21 DIAGNOSIS — I10 Essential (primary) hypertension: Secondary | ICD-10-CM | POA: Diagnosis not present

## 2021-05-21 DIAGNOSIS — E782 Mixed hyperlipidemia: Secondary | ICD-10-CM | POA: Diagnosis not present

## 2021-05-21 DIAGNOSIS — M1712 Unilateral primary osteoarthritis, left knee: Secondary | ICD-10-CM | POA: Diagnosis not present

## 2021-05-21 DIAGNOSIS — E039 Hypothyroidism, unspecified: Secondary | ICD-10-CM | POA: Diagnosis not present

## 2021-05-22 ENCOUNTER — Other Ambulatory Visit: Payer: Self-pay | Admitting: Physician Assistant

## 2021-05-23 ENCOUNTER — Other Ambulatory Visit: Payer: Self-pay | Admitting: Physician Assistant

## 2021-05-28 ENCOUNTER — Other Ambulatory Visit: Payer: Self-pay | Admitting: Physician Assistant

## 2021-05-29 ENCOUNTER — Other Ambulatory Visit: Payer: Self-pay | Admitting: Physician Assistant

## 2021-05-29 DIAGNOSIS — J3801 Paralysis of vocal cords and larynx, unilateral: Secondary | ICD-10-CM

## 2021-06-02 ENCOUNTER — Encounter: Payer: Self-pay | Admitting: Dermatology

## 2021-06-02 NOTE — Progress Notes (Signed)
° °  Follow-Up Visit   Subjective  Mary Marks is a 86 y.o. female who presents for the following: Annual Exam (Pt here for annual. Pt would like areas where psoriasis is (scalp, elbows, knees)).  General skin examination, recheck psoriasis Location:  Duration:  Quality:  Associated Signs/Symptoms: Modifying Factors:  Severity:  Timing: Context:   Objective  Well appearing patient in no apparent distress; mood and affect are within normal limits. Full body exam no sign atypical pigmented lesions, 1 possible nonmelanoma skin cancer chest will be biopsied.  Left Elbow - Posterior, Left Knee - Anterior, Right Elbow - Posterior, Right Knee - Anterior Active psoriatic plaques 90+ percent clear with Toltz.  Left Thigh - Anterior, Right Lower Back Slightly inflamed flattopped tan 6 mm textured papule  Left Breast 6 mm focally eroded crust, rule out carcinoma         A full examination was performed including scalp, head, eyes, ears, nose, lips, neck, chest, axillae, abdomen, back, buttocks, bilateral upper extremities, bilateral lower extremities, hands, feet, fingers, toes, fingernails, and toenails. All findings within normal limits unless otherwise noted below.  Areas beneath undergarments not fully examined   Assessment & Plan    Screening exam for skin cancer  Annual skin examination  Psoriasis Left Elbow - Posterior; Right Elbow - Posterior; Left Knee - Anterior; Right Knee - Anterior  No change in therapy  Related Medications pregabalin (LYRICA) 200 MG capsule Take 1 capsule (200 mg total) by mouth 2 (two) times daily.  Inflamed seborrheic keratosis (2) Left Thigh - Anterior; Right Lower Back  No intervention necessary  Neoplasm of uncertain behavior of skin Left Breast  Skin / nail biopsy Type of biopsy: tangential   Informed consent: discussed and consent obtained   Timeout: patient name, date of birth, surgical site, and procedure verified    Anesthesia: the lesion was anesthetized in a standard fashion   Anesthetic:  1% lidocaine w/ epinephrine 1-100,000 local infiltration Instrument used: flexible razor blade   Hemostasis achieved with: ferric subsulfate   Outcome: patient tolerated procedure well   Post-procedure details: sterile dressing applied and wound care instructions given   Dressing type: bandage and petrolatum    Specimen 1 - Surgical pathology Differential Diagnosis: R/O BCC VS SCC  Check Margins: No  Seborrheic keratosis  Cherry angioma      I, Lavonna Monarch, MD, have reviewed all documentation for this visit.  The documentation on 06/02/21 for the exam, diagnosis, procedures, and orders are all accurate and complete.

## 2021-06-10 DIAGNOSIS — E875 Hyperkalemia: Secondary | ICD-10-CM | POA: Diagnosis not present

## 2021-06-18 ENCOUNTER — Other Ambulatory Visit: Payer: Self-pay | Admitting: Physician Assistant

## 2021-06-18 DIAGNOSIS — J3801 Paralysis of vocal cords and larynx, unilateral: Secondary | ICD-10-CM

## 2021-06-19 ENCOUNTER — Ambulatory Visit
Admission: RE | Admit: 2021-06-19 | Discharge: 2021-06-19 | Disposition: A | Discharge status: Home / Self Care | Payer: Medicare HMO | Source: Ambulatory Visit | Attending: Physician Assistant | Admitting: Physician Assistant

## 2021-06-19 ENCOUNTER — Other Ambulatory Visit: Payer: Self-pay

## 2021-06-19 DIAGNOSIS — I6529 Occlusion and stenosis of unspecified carotid artery: Secondary | ICD-10-CM | POA: Diagnosis not present

## 2021-06-19 DIAGNOSIS — J3800 Paralysis of vocal cords and larynx, unspecified: Secondary | ICD-10-CM | POA: Diagnosis not present

## 2021-06-19 DIAGNOSIS — M47812 Spondylosis without myelopathy or radiculopathy, cervical region: Secondary | ICD-10-CM | POA: Diagnosis not present

## 2021-06-19 DIAGNOSIS — I639 Cerebral infarction, unspecified: Secondary | ICD-10-CM | POA: Diagnosis not present

## 2021-06-19 DIAGNOSIS — I700 Atherosclerosis of aorta: Secondary | ICD-10-CM | POA: Diagnosis not present

## 2021-06-24 DIAGNOSIS — J3801 Paralysis of vocal cords and larynx, unilateral: Secondary | ICD-10-CM | POA: Diagnosis not present

## 2021-06-24 DIAGNOSIS — J384 Edema of larynx: Secondary | ICD-10-CM | POA: Diagnosis not present

## 2021-06-24 DIAGNOSIS — J383 Other diseases of vocal cords: Secondary | ICD-10-CM | POA: Diagnosis not present

## 2021-07-22 ENCOUNTER — Other Ambulatory Visit: Payer: Self-pay | Admitting: Otolaryngology

## 2021-08-01 NOTE — Progress Notes (Signed)
Surgical Instructions ? ? ? Your procedure is scheduled on Friday, May 5th, 2023. ? ? Report to [facility] Main Entrance "A" at 10:10 A.M., then check in with the Admitting office. ? Call this number if you have problems the morning of surgery: ? [phone] ? ? If you have any questions prior to your surgery date call [phone]: Open Monday-Friday 8am-4pm ? ? ? Remember: ? Do not eat or drink after midnight the night before your surgery ?  ? Take these medicines the morning of surgery with A SIP OF WATER:  ? ?atorvastatin (LIPITOR) ?cetirizine (ZYRTEC) ?fluticasone (FLONASE) ?levothyroxine (SYNTHROID) ?metoprolol succinate (TOPROL-XL) ?pregabalin (LYRICA) ?sertraline (ZOLOFT)  ? ?ALPRAZolam Duanne Moron) - if needed ? ?As of today, STOP taking any Aspirin (unless otherwise instructed by your surgeon) Aleve, Naproxen, Ibuprofen, Motrin, Advil, Goody's, BC's, all herbal medications, fish oil, and all vitamins. ? ? ? The day of surgery: ?         ?Do not wear jewelry or makeup ?Do not wear lotions, powders, perfumes, or deodorant. ?Do not shave 48 hours prior to surgery.   ?Do not bring valuables to the hospital. ?Do not wear nail polish, gel polish, artificial nails, or any other type of covering on natural nails (fingers and toes) ?If you have artificial nails or gel coating that need to be removed by a nail salon, please have this removed prior to surgery. Artificial nails or gel coating may interfere with anesthesia's ability to adequately monitor your vital signs. ? ? ?[facility] is not responsible for any belongings or valuables. .  ? ?Do NOT Smoke (Tobacco/Vaping)  24 hours prior to your procedure ? ?If you use a CPAP at night, you may bring your mask for your overnight stay. ?  ?Contacts, glasses, hearing aids, dentures or partials may not be worn into surgery, please bring cases for these belongings ?  ?For patients admitted to the hospital, discharge time will be determined by your treatment team. ?   ?Patients discharged the day of surgery will not be allowed to drive home, and someone needs to stay with them for 24 hours. ? ? ?SURGICAL WAITING ROOM VISITATION ?Patients having surgery or a procedure in a hospital may have two support people. ?Children under the age of 50 must have an adult with them who is not the patient. ?They may stay in the waiting area during the procedure and may switch out with other visitors. If the patient needs to stay at the hospital during part of their recovery, the visitor guidelines for inpatient rooms apply. ? ?Please refer to the [facility] website for the visitor guidelines for Inpatients (after your surgery is over and you are in a regular room).  ? ? ?Special instructions:   ? ?Oral Hygiene is also important to reduce your risk of infection.  Remember - BRUSH YOUR TEETH THE MORNING OF SURGERY WITH YOUR REGULAR TOOTHPASTE ? ? ?[facility]- Preparing For Surgery ? ?Before surgery, you can play an important role. Because skin is not sterile, your skin needs to be as free of germs as possible. You can reduce the number of germs on your skin by washing with CHG (chlorahexidine gluconate) Soap before surgery.  CHG is an antiseptic cleaner which kills germs and bonds with the skin to continue killing germs even after washing.   ? ? ?Please do not use if you have an allergy to CHG or antibacterial soaps. If your skin becomes reddened/irritated stop using the CHG.  ?Do not shave (  including legs and underarms) for at least 48 hours prior to first CHG shower. It is OK to shave your face. ? ?Please follow these instructions carefully. ?  ? ? Shower the NIGHT BEFORE SURGERY and the MORNING OF SURGERY with CHG Soap.  ? If you chose to wash your hair, wash your hair first as usual with your normal shampoo. After you shampoo, rinse your hair and body thoroughly to remove the shampoo.  Then [employer] and genitals (private parts) with your normal soap and rinse thoroughly to remove  soap. ? ?After that Use CHG Soap as you would any other liquid soap. You can apply CHG directly to the skin and wash gently with a scrungie or a clean washcloth.  ? ?Apply the CHG Soap to your body ONLY FROM THE NECK DOWN.  Do not use on open wounds or open sores. Avoid contact with your eyes, ears, mouth and genitals (private parts). Wash Face and genitals (private parts)  with your normal soap.  ? ?Wash thoroughly, paying special attention to the area where your surgery will be performed. ? ?Thoroughly rinse your body with warm water from the neck down. ? ?DO NOT shower/wash with your normal soap after using and rinsing off the CHG Soap. ? ?Pat yourself dry with a CLEAN TOWEL. ? ?Wear CLEAN PAJAMAS to bed the night before surgery ? ?Place CLEAN SHEETS on your bed the night before your surgery ? ?DO NOT SLEEP WITH PETS. ? ? ?Day of Surgery: ? ?Take a shower with CHG soap. ?Wear Clean/Comfortable clothing the morning of surgery ?Do not apply any deodorants/lotions.   ?Remember to brush your teeth WITH YOUR REGULAR TOOTHPASTE. ? ? ? ?If you received a COVID test during your pre-op visit, it is requested that you wear a mask when out in public, stay away from anyone that may not be feeling well, and notify your surgeon if you develop symptoms. If you have been in contact with anyone that has tested positive in the last 10 days, please notify your surgeon. ? ?  ?Please read over the following fact sheets that you were given.   ?

## 2021-08-04 ENCOUNTER — Other Ambulatory Visit: Payer: Self-pay

## 2021-08-04 ENCOUNTER — Encounter (HOSPITAL_COMMUNITY): Payer: Self-pay

## 2021-08-04 ENCOUNTER — Encounter (HOSPITAL_COMMUNITY)
Admission: RE | Admit: 2021-08-04 | Discharge: 2021-08-04 | Disposition: A | Discharge status: Home / Self Care | Payer: Medicare HMO | Source: Ambulatory Visit | Attending: Otolaryngology | Admitting: Otolaryngology

## 2021-08-04 VITALS — BP 185/82 | HR 55 | Temp 98.2°F | Resp 17 | Ht 65.0 in | Wt 194.0 lb

## 2021-08-04 DIAGNOSIS — Z20822 Contact with and (suspected) exposure to covid-19: Secondary | ICD-10-CM | POA: Insufficient documentation

## 2021-08-04 DIAGNOSIS — I10 Essential (primary) hypertension: Secondary | ICD-10-CM | POA: Diagnosis not present

## 2021-08-04 DIAGNOSIS — Z01818 Encounter for other preprocedural examination: Secondary | ICD-10-CM | POA: Diagnosis not present

## 2021-08-04 HISTORY — DX: Dyspnea, unspecified: R06.00

## 2021-08-04 HISTORY — DX: Anxiety disorder, unspecified: F41.9

## 2021-08-04 HISTORY — DX: Unspecified osteoarthritis, unspecified site: M19.90

## 2021-08-04 HISTORY — DX: Hypothyroidism, unspecified: E03.9

## 2021-08-04 LAB — CBC
HCT: 43.3 % (ref 36.0–46.0)
Hemoglobin: 14.2 g/dL (ref 12.0–15.0)
MCH: 28.1 pg (ref 26.0–34.0)
MCHC: 32.8 g/dL (ref 30.0–36.0)
MCV: 85.6 fL (ref 80.0–100.0)
Platelets: 221 10*3/uL (ref 150–400)
RBC: 5.06 MIL/uL (ref 3.87–5.11)
RDW: 14.2 % (ref 11.5–15.5)
WBC: 6.6 10*3/uL (ref 4.0–10.5)
nRBC: 0 % (ref 0.0–0.2)

## 2021-08-04 LAB — BASIC METABOLIC PANEL
Anion gap: 7 (ref 5–15)
BUN: 13 mg/dL (ref 8–23)
CO2: 29 mmol/L (ref 22–32)
Calcium: 9.3 mg/dL (ref 8.9–10.3)
Chloride: 105 mmol/L (ref 98–111)
Creatinine, Ser: 0.81 mg/dL (ref 0.44–1.00)
GFR, Estimated: >60 mL/min (ref >60)
Glucose, Bld: 103 mg/dL — ABNORMAL HIGH (ref 70–99)
Potassium: 4.1 mmol/L (ref 3.5–5.1)
Sodium: 141 mmol/L (ref 135–145)

## 2021-08-04 NOTE — Progress Notes (Signed)
PCP - Loletha Grayer, NP [place], [place]) ?Cardiologist - denies ? ?PPM/ICD - denies ? ? ?Chest x-ray - denies ?EKG - 08/04/21 at PAT ?Stress Test - denies ?ECHO - denies ?Cardiac Cath - denies ? ?Sleep Study - denies ? ? ?DM- denies ? ?ASA/Blood Thinner Instructions: n/a ? ? ?ERAS Protcol - no, NPO ? ? ?COVID TEST- 08/04/21 at PAT ? ? ?Anesthesia review: no ? ?Patient denies shortness of breath, fever, cough and chest pain at PAT appointment ? ? ?All instructions explained to the patient, with a verbal understanding of the material. Patient agrees to go over the instructions while at home for a better understanding. Patient also instructed to wear a mask in public after being tested for COVID-19. The opportunity to ask questions was provided. ?  ?

## 2021-08-05 LAB — SARS CORONAVIRUS 2 (TAT 6-24 HRS): SARS Coronavirus 2: NEGATIVE

## 2021-08-08 ENCOUNTER — Ambulatory Visit (HOSPITAL_COMMUNITY): Payer: Medicare HMO | Admitting: Anesthesiology

## 2021-08-08 ENCOUNTER — Ambulatory Visit (HOSPITAL_BASED_OUTPATIENT_CLINIC_OR_DEPARTMENT_OTHER): Payer: Medicare HMO | Admitting: Anesthesiology

## 2021-08-08 ENCOUNTER — Other Ambulatory Visit: Payer: Self-pay

## 2021-08-08 ENCOUNTER — Encounter (HOSPITAL_COMMUNITY)
Admission: RE | Disposition: A | Discharge status: Home / Self Care | Payer: Self-pay | Source: Home / Self Care | Attending: Otolaryngology

## 2021-08-08 ENCOUNTER — Encounter (HOSPITAL_COMMUNITY): Payer: Self-pay | Admitting: Otolaryngology

## 2021-08-08 ENCOUNTER — Observation Stay (HOSPITAL_COMMUNITY)
Admission: RE | Admit: 2021-08-08 | Discharge: 2021-08-09 | Disposition: A | Discharge status: Home / Self Care | Payer: Medicare HMO | Attending: Otolaryngology | Admitting: Otolaryngology

## 2021-08-08 DIAGNOSIS — I10 Essential (primary) hypertension: Secondary | ICD-10-CM

## 2021-08-08 DIAGNOSIS — J3801 Paralysis of vocal cords and larynx, unilateral: Secondary | ICD-10-CM | POA: Diagnosis not present

## 2021-08-08 DIAGNOSIS — R490 Dysphonia: Secondary | ICD-10-CM | POA: Diagnosis present

## 2021-08-08 DIAGNOSIS — Z85828 Personal history of other malignant neoplasm of skin: Secondary | ICD-10-CM | POA: Diagnosis not present

## 2021-08-08 DIAGNOSIS — Z79899 Other long term (current) drug therapy: Secondary | ICD-10-CM | POA: Diagnosis not present

## 2021-08-08 DIAGNOSIS — Z7982 Long term (current) use of aspirin: Secondary | ICD-10-CM | POA: Diagnosis not present

## 2021-08-08 DIAGNOSIS — J3800 Paralysis of vocal cords and larynx, unspecified: Secondary | ICD-10-CM

## 2021-08-08 DIAGNOSIS — E039 Hypothyroidism, unspecified: Secondary | ICD-10-CM | POA: Insufficient documentation

## 2021-08-08 DIAGNOSIS — F418 Other specified anxiety disorders: Secondary | ICD-10-CM | POA: Diagnosis not present

## 2021-08-08 HISTORY — PX: LARYNGOPLASTY: SHX282

## 2021-08-08 SURGERY — LARYNGOPLASTY
Anesthesia: General | Site: Neck | Laterality: Left

## 2021-08-08 MED ORDER — CELECOXIB 100 MG PO CAPS
100.0000 mg | ORAL_CAPSULE | Freq: Two times a day (BID) | ORAL | Status: DC
  Filled 2021-08-08: qty 1

## 2021-08-08 MED ORDER — LEVOTHYROXINE SODIUM 88 MCG PO TABS: 88.0000 ug | ORAL_TABLET | Freq: Every day | ORAL | Status: DC

## 2021-08-08 MED ORDER — VITAMIN D 25 MCG (1000 UNIT) PO TABS
1000.0000 [IU] | ORAL_TABLET | Freq: Every day | ORAL | Status: DC
  Administered 2021-08-08 – 2021-08-09 (×2): 1000 [IU] via ORAL
  Filled 2021-08-08 (×2): qty 1

## 2021-08-08 MED ORDER — ACETAMINOPHEN 10 MG/ML IV SOLN
1000.0000 mg | Freq: Once | INTRAVENOUS | Status: DC | PRN
  Administered 2021-08-08: 1000 mg via INTRAVENOUS

## 2021-08-08 MED ORDER — BUSPIRONE HCL 5 MG PO TABS: 5.0000 mg | ORAL_TABLET | Freq: Three times a day (TID) | ORAL | Status: DC

## 2021-08-08 MED ORDER — LEVOTHYROXINE SODIUM 112 MCG PO TABS
112.0000 ug | ORAL_TABLET | Freq: Every day | ORAL | Status: DC
  Administered 2021-08-09: 112 ug via ORAL
  Filled 2021-08-08: qty 1

## 2021-08-08 MED ORDER — ORAL CARE MOUTH RINSE: 15.0000 mL | Freq: Once | OROMUCOSAL | Status: AC

## 2021-08-08 MED ORDER — ATORVASTATIN CALCIUM 10 MG PO TABS
20.0000 mg | ORAL_TABLET | Freq: Every day | ORAL | Status: DC
  Filled 2021-08-08: qty 2

## 2021-08-08 MED ORDER — 0.9 % SODIUM CHLORIDE (POUR BTL) OPTIME
TOPICAL | Status: DC | PRN
  Administered 2021-08-08: 1000 mL

## 2021-08-08 MED ORDER — LISINOPRIL 20 MG PO TABS
40.0000 mg | ORAL_TABLET | Freq: Every day | ORAL | Status: DC
  Administered 2021-08-08 – 2021-08-09 (×2): 40 mg via ORAL
  Filled 2021-08-08 (×2): qty 2

## 2021-08-08 MED ORDER — FUROSEMIDE 20 MG PO TABS
20.0000 mg | ORAL_TABLET | Freq: Every day | ORAL | Status: DC
  Administered 2021-08-08 – 2021-08-09 (×2): 20 mg via ORAL
  Filled 2021-08-08 (×2): qty 1

## 2021-08-08 MED ORDER — OXYMETAZOLINE HCL 0.05 % NA SOLN
NASAL | Status: DC | PRN
  Administered 2021-08-08: 1

## 2021-08-08 MED ORDER — CHLORHEXIDINE GLUCONATE 0.12 % MT SOLN
15.0000 mL | Freq: Once | OROMUCOSAL | Status: AC
  Administered 2021-08-08: 15 mL via OROMUCOSAL
  Filled 2021-08-08: qty 15

## 2021-08-08 MED ORDER — KCL IN DEXTROSE-NACL 20-5-0.45 MEQ/L-%-% IV SOLN
INTRAVENOUS | Status: DC
  Filled 2021-08-08 (×2): qty 1000

## 2021-08-08 MED ORDER — ACETAMINOPHEN 500 MG PO TABS: 1000.0000 mg | ORAL_TABLET | Freq: Once | ORAL | Status: DC | PRN

## 2021-08-08 MED ORDER — LIDOCAINE-EPINEPHRINE 1 %-1:100000 IJ SOLN
INTRAMUSCULAR | Status: DC | PRN
  Administered 2021-08-08: 5 mL

## 2021-08-08 MED ORDER — SERTRALINE HCL 100 MG PO TABS
100.0000 mg | ORAL_TABLET | Freq: Every day | ORAL | Status: DC
Start: 2021-08-09 — End: 2021-08-09
  Administered 2021-08-09: 100 mg via ORAL
  Filled 2021-08-08: qty 1

## 2021-08-08 MED ORDER — PROPOFOL 500 MG/50ML IV EMUL
INTRAVENOUS | Status: DC | PRN
  Administered 2021-08-08: 75 ug/kg/min via INTRAVENOUS

## 2021-08-08 MED ORDER — FENTANYL CITRATE (PF) 100 MCG/2ML IJ SOLN
INTRAMUSCULAR | Status: DC | PRN
  Administered 2021-08-08 (×2): 50 ug via INTRAVENOUS
  Administered 2021-08-08 (×2): 25 ug via INTRAVENOUS

## 2021-08-08 MED ORDER — PREGABALIN 100 MG PO CAPS
200.0000 mg | ORAL_CAPSULE | Freq: Two times a day (BID) | ORAL | Status: DC
  Administered 2021-08-08 – 2021-08-09 (×2): 200 mg via ORAL
  Filled 2021-08-08 (×2): qty 2

## 2021-08-08 MED ORDER — MORPHINE SULFATE (PF) 2 MG/ML IV SOLN: 2.0000 mg | INTRAVENOUS | Status: DC | PRN

## 2021-08-08 MED ORDER — OXYMETAZOLINE HCL 0.05 % NA SOLN
NASAL | Status: AC
  Filled 2021-08-08: qty 30

## 2021-08-08 MED ORDER — ACETAMINOPHEN 160 MG/5ML PO SOLN: 1000.0000 mg | Freq: Once | ORAL | Status: DC | PRN

## 2021-08-08 MED ORDER — HYDROCODONE-ACETAMINOPHEN 5-325 MG PO TABS
1.0000 | ORAL_TABLET | ORAL | Status: DC | PRN
  Administered 2021-08-09 (×2): 2 via ORAL
  Filled 2021-08-08 (×2): qty 2

## 2021-08-08 MED ORDER — LIDOCAINE-EPINEPHRINE 1 %-1:100000 IJ SOLN
INTRAMUSCULAR | Status: AC
  Filled 2021-08-08: qty 1

## 2021-08-08 MED ORDER — ALPRAZOLAM 0.25 MG PO TABS
0.5000 mg | ORAL_TABLET | Freq: Three times a day (TID) | ORAL | Status: DC
  Administered 2021-08-08 – 2021-08-09 (×2): 0.5 mg via ORAL
  Filled 2021-08-08 (×2): qty 2

## 2021-08-08 MED ORDER — METOPROLOL SUCCINATE ER 100 MG PO TB24
100.0000 mg | ORAL_TABLET | Freq: Every day | ORAL | Status: DC
  Administered 2021-08-09: 100 mg via ORAL
  Filled 2021-08-08: qty 1

## 2021-08-08 MED ORDER — CLOBETASOL PROPIONATE 0.05 % EX SOLN: 1.0000 "application " | Freq: Every day | CUTANEOUS | Status: DC

## 2021-08-08 MED ORDER — LORATADINE 10 MG PO TABS
10.0000 mg | ORAL_TABLET | Freq: Every day | ORAL | Status: DC
Start: 2021-08-08 — End: 2021-08-09
  Administered 2021-08-08 – 2021-08-09 (×2): 10 mg via ORAL
  Filled 2021-08-08 (×2): qty 1

## 2021-08-08 MED ORDER — ACETAMINOPHEN 10 MG/ML IV SOLN
INTRAVENOUS | Status: AC
  Filled 2021-08-08: qty 100

## 2021-08-08 MED ORDER — LISINOPRIL 10 MG PO TABS: 10.0000 mg | ORAL_TABLET | Freq: Every day | ORAL | Status: DC

## 2021-08-08 MED ORDER — FENTANYL CITRATE (PF) 250 MCG/5ML IJ SOLN
INTRAMUSCULAR | Status: AC
  Filled 2021-08-08: qty 5

## 2021-08-08 MED ORDER — LACTATED RINGERS IV SOLN: INTRAVENOUS | Status: DC

## 2021-08-08 MED ORDER — CEFAZOLIN SODIUM-DEXTROSE 2-4 GM/100ML-% IV SOLN
2.0000 g | INTRAVENOUS | Status: AC
Start: 2021-08-08 — End: 2021-08-08
  Administered 2021-08-08: 2 g via INTRAVENOUS
  Filled 2021-08-08: qty 100

## 2021-08-08 MED ORDER — FENTANYL CITRATE (PF) 100 MCG/2ML IJ SOLN: 25.0000 ug | INTRAMUSCULAR | Status: DC | PRN

## 2021-08-08 SURGICAL SUPPLY — 44 items
ADH SKN CLS APL DERMABOND .7 (GAUZE/BANDAGES/DRESSINGS) ×1
APL SKNCLS STERI-STRIP NONHPOA (GAUZE/BANDAGES/DRESSINGS)
BAG COUNTER SPONGE SURGICOUNT (BAG) ×2 IMPLANT
BAG SPNG CNTER NS LX DISP (BAG) ×1
BENZOIN TINCTURE PRP APPL 2/3 (GAUZE/BANDAGES/DRESSINGS) ×1 IMPLANT
BLADE SCALPEL THERMAL 15 (MISCELLANEOUS) ×2 IMPLANT
BLOCK SILICONE 1.91X1.91 (MISCELLANEOUS) ×1 IMPLANT
BUR ROUND FLUTED 4 SOFT TCH (BURR) ×2 IMPLANT
BUR STRYKR 3.0 RD FLUT MED (BURR) ×1 IMPLANT
CANISTER SUCT 3000ML PPV (MISCELLANEOUS) ×2 IMPLANT
CLEANER TIP ELECTROSURG 2X2 (MISCELLANEOUS) ×2 IMPLANT
COVER SURGICAL LIGHT HANDLE (MISCELLANEOUS) ×2 IMPLANT
DEFOGGER ANTIFOG KIT (MISCELLANEOUS) ×1 IMPLANT
DERMABOND ADVANCED (GAUZE/BANDAGES/DRESSINGS) ×1
DERMABOND ADVANCED .7 DNX12 (GAUZE/BANDAGES/DRESSINGS) ×1 IMPLANT
DRAIN HEMOVAC 7FR (DRAIN) IMPLANT
DRAPE HALF SHEET 40X57 (DRAPES) ×2 IMPLANT
DRAPE SURG 17X23 STRL (DRAPES) ×2 IMPLANT
ELECT COATED BLADE 2.86 ST (ELECTRODE) ×3 IMPLANT
ELECT NDL BLADE 2-5/6 (NEEDLE) ×1 IMPLANT
ELECT NEEDLE BLADE 2-5/6 (NEEDLE) ×2 IMPLANT
ELECT REM PT RETURN 9FT ADLT (ELECTROSURGICAL) ×2
ELECTRODE REM PT RTRN 9FT ADLT (ELECTROSURGICAL) ×1 IMPLANT
GLOVE BIO SURGEON STRL SZ7.5 (GLOVE) ×4 IMPLANT
GOWN STRL REUS W/ TWL LRG LVL3 (GOWN DISPOSABLE) ×2 IMPLANT
GOWN STRL REUS W/TWL LRG LVL3 (GOWN DISPOSABLE) ×4
IV CATH 18G X1.75 CATHLON (IV SOLUTION) ×1 IMPLANT
KIT BASIN OR (CUSTOM PROCEDURE TRAY) ×2 IMPLANT
KIT TURNOVER KIT B (KITS) ×2 IMPLANT
NDL HYPO 25GX1X1/2 BEV (NEEDLE) ×1 IMPLANT
NEEDLE HYPO 25GX1X1/2 BEV (NEEDLE) ×4 IMPLANT
NS IRRIG 1000ML POUR BTL (IV SOLUTION) ×2 IMPLANT
PATTIES SURGICAL .5 X3 (DISPOSABLE) ×2 IMPLANT
PENCIL SMOKE EVACUATOR (MISCELLANEOUS) ×2 IMPLANT
POSITIONER HEAD DONUT 9IN (MISCELLANEOUS) ×2 IMPLANT
SILICONE BLOCK 1.91CM X1.91CMX 0.95CM ×1 IMPLANT
SPONGE INTESTINAL PEANUT (DISPOSABLE) ×2 IMPLANT
SUT ETHILON 2 0 FS 18 (SUTURE) ×6 IMPLANT
SUT ETHILON 4 0 PS 2 18 (SUTURE) ×1 IMPLANT
SUT VIC AB 3-0 SH 27 (SUTURE) ×6
SUT VIC AB 3-0 SH 27X BRD (SUTURE) ×2 IMPLANT
SUT VIC AB 4-0 PS2 18 (SUTURE) ×1 IMPLANT
SUT VIC AB 4-0 PS2 27 (SUTURE) ×1 IMPLANT
TRAY ENT MC OR (CUSTOM PROCEDURE TRAY) ×2 IMPLANT

## 2021-08-08 NOTE — H&P (Signed)
Mary Marks is an 86 y.o. female.   ?Chief Complaint: Left vocal fold paralysis ?HPI: 86 year old female with left vocal fold paralysis and resulting breathy dysphonia. ? ?Past Medical History:  ?Diagnosis Date  ? Anxiety   ? Arthritis   ? in knees  ? Basal cell carcinoma 07/26/2019  ? nodular on right lower back - CX3+5FU  ? Cancer [facility])   ? Melanoma on back  ? Depression   ? Dyspnea   ? Hyperlipidemia   ? Hypertension   ? Hypothyroidism   ? Melanoma ([place])   ? left post shoulder  ? Squamous cell carcinoma of skin 03/20/1999  ? well diff-lower bulb nose  (txpb)  ? Thyroid disease   ? Hypothyroid  ? ? ?Past Surgical History:  ?Procedure Laterality Date  ? DILATION AND CURETTAGE OF UTERUS    ? EXCISION OF BACK LESION    ? Melanoma  ? TUBAL LIGATION  1980  ? ? ?History reviewed. No pertinent family history. ?Social History:  reports that she has never smoked. She has never used smokeless tobacco. She reports that she does not drink alcohol and does not use drugs. ? ?Allergies:  ?Allergies  ?Allergen Reactions  ? Pravachol [Pravastatin] Rash  ? ? ?Medications Prior to Admission  ?Medication Sig Dispense Refill  ? ALPRAZolam (XANAX) 0.5 MG tablet Take 1 tablet (0.5 mg total) by mouth 2 (two) times daily as needed for anxiety. (Patient taking differently: Take 0.5 mg by mouth 3 (three) times daily.) 60 tablet 3  ? Aspirin-Caffeine (BC FAST PAIN RELIEF PO) Take 1 Package by mouth daily as needed (pain).    ? atorvastatin (LIPITOR) 20 MG tablet Take 1 tablet (20 mg total) by mouth daily. 90 tablet 2  ? cetirizine (ZYRTEC) 10 MG tablet Take 10 mg by mouth daily.    ? cholecalciferol (VITAMIN D3) 25 MCG (1000 UNIT) tablet Take 1,000 Units by mouth daily.    ? fluticasone (FLONASE) 50 MCG/ACT nasal spray Place 1 spray into both nostrils daily. 16 g 3  ? furosemide (LASIX) 20 MG tablet TAKE 1 TABLET EVERY DAY (Patient taking differently: Take 20 mg by mouth daily as needed for edema.) 90 tablet 0  ? hydrocortisone cream 1 % Apply  1 application. topically daily as needed for itching (psoriasis).    ? levothyroxine (SYNTHROID) 112 MCG tablet Take 112 mcg by mouth every morning.    ? lisinopril (ZESTRIL) 40 MG tablet Take 40 mg by mouth daily.    ? metoprolol succinate (TOPROL-XL) 100 MG 24 hr tablet Take 100 mg by mouth daily.    ? pregabalin (LYRICA) 200 MG capsule Take 1 capsule (200 mg total) by mouth 2 (two) times daily. 180 capsule 2  ? sertraline (ZOLOFT) 100 MG tablet Take 1 tablet (100 mg total) by mouth daily. 90 tablet 1  ? busPIRone (BUSPAR) 5 MG tablet TAKE 1 TABLET (5 MG TOTAL) BY MOUTH 3 (THREE) TIMES DAILY. (Patient not taking: Reported on 05/14/2021) 270 tablet 0  ? celecoxib (CELEBREX) 100 MG capsule Take 1 capsule (100 mg total) by mouth 2 (two) times daily. (Patient not taking: Reported on 07/29/2021) 180 capsule 2  ? clobetasol (TEMOVATE) 0.05 % external solution Apply 1 application topically daily. (Patient not taking: Reported on 07/29/2021) 50 mL 1  ? levothyroxine (SYNTHROID) 88 MCG tablet Take 1 tablet (88 mcg total) by mouth daily. (Patient not taking: Reported on 07/29/2021) 90 tablet 2  ? lisinopril (ZESTRIL) 10 MG tablet Take 1  tablet (10 mg total) by mouth daily. (Patient not taking: Reported on 07/29/2021) 90 tablet 1  ? TALTZ 80 MG/ML SOSY INJECT '80MG'$  (1 SYRINGE) UNDER THE SKIN EVERY 4 WEEKS (Patient not taking: Reported on 07/29/2021) 1 mL 0  ? ? ?No results found for this or any previous visit (from the past 48 hour(s)). ?No results found. ? ?Review of Systems  ?All other systems reviewed and are negative. ? ?Blood pressure (!) 161/78, pulse 60, temperature 98.3 ?F (36.8 ?C), resp. rate 18, height '5\' 5"'$  (1.651 m), weight 88 kg, SpO2 92 %. ?Physical Exam ?Constitutional:   ?   Appearance: Normal appearance. She is normal weight.  ?HENT:  ?   Head: Normocephalic and atraumatic.  ?   Right Ear: External ear normal.  ?   Left Ear: External ear normal.  ?   Nose: Nose normal.  ?   Mouth/Throat:  ?   Mouth: Mucous  membranes are moist.  ?   Pharynx: Oropharynx is clear.  ?   Comments: Markedly hoarse, breathy. ?Eyes:  ?   Extraocular Movements: Extraocular movements intact.  ?   Conjunctiva/sclera: Conjunctivae normal.  ?   Pupils: Pupils are equal, round, and reactive to light.  ?Cardiovascular:  ?   Rate and Rhythm: Normal rate.  ?Pulmonary:  ?   Effort: Pulmonary effort is normal.  ?Musculoskeletal:  ?   Cervical back: Normal range of motion.  ?Skin: ?   General: Skin is warm and dry.  ?Neurological:  ?   General: No focal deficit present.  ?   Mental Status: She is alert and oriented to person, place, and time.  ?Psychiatric:     ?   Mood and Affect: Mood normal.     ?   Behavior: Behavior normal.     ?   Thought Content: Thought content normal.     ?   Judgment: Judgment normal.  ?  ? ?Assessment/Plan ?Left vocal fold paralysis ? ?To OR for left medialization laryngoplasty. ? ?Melida Quitter, MD ?08/08/2021, 11:32 AM ? ? ? ?

## 2021-08-08 NOTE — Progress Notes (Signed)
Kaylie [place] received to room 6N31C/6N31C-01 from PACU s/p Left Medialization laryngoplasty.  Pt is alert and oriented x 4.  Incision to left anterior neck intact with skin glue, no swelling or drainage noted.  O2 2 liters n/c intact.  Telemetry and continuous pulse oximetry initiated.  HR mid 40's which pt states is not her normal-she did state she took her Metoprolol this AM which she confirms she takes for blood pressure.  ? ?BP (!) 160/68 (BP Location: Left Arm)   Pulse (!) 46   Temp 97.8 ?F (36.6 ?C) (Oral)   Resp 20   Ht '5\' 5"'$  (1.651 m)   Wt 87 kg   SpO2 93%   BMI 31.92 kg/m?  ? ?Patient oriented to room and unit.  Call bell and personal items in reach.  Admission history completed.  Plan of care initiated. ?Ayesha Mohair BSN RN CMSRN ?08/08/2021, 8:50 PM ? ? ?  ?

## 2021-08-08 NOTE — Transfer of Care (Signed)
Immediate Anesthesia Transfer of Care Note ? ?Patient: Mary Marks ? ?Procedure(s) Performed: LEFT MEDIALIZATION LARYNGOPLASTY (Left: Neck) ? ?Patient Location: PACU ? ?Anesthesia Type:MAC ? ?Level of Consciousness: awake ? ?Airway & Oxygen Therapy: Patient Spontanous Breathing and Patient connected to nasal cannula oxygen ? ?Post-op Assessment: Report given to RN and Post -op Vital signs reviewed and stable ? ?Post vital signs: Reviewed and stable ? ?Last Vitals:  ?Vitals Value Taken Time  ?BP 125/66 08/08/21 1337  ?Temp    ?Pulse 53 08/08/21 1338  ?Resp 15 08/08/21 1338  ?SpO2 93 % 08/08/21 1338  ?Vitals shown include unvalidated device data. ? ?Last Pain:  ?Vitals:  ? 08/08/21 1028  ?PainSc: 0-No pain  ?   ? ?  ? ?Complications: No notable events documented. ?

## 2021-08-08 NOTE — Op Note (Signed)
Preop diagnosis: Dysphonia, left vocal fold paralysis ?Postop diagnosis: same ?Procedure: Left medialization laryngoplasty ?Surgeon: Redmond Baseman ?Assist: RNFA ?Anesth: IV sedation and local with 1% lidocaine with 1:100,000 epinephrine ?Compl: None ?Findings: Normal laryngeal anatomy except for immobile and bowed left vocal fold. ?Description:  After discussing risks, benefits, and alternatives, the patient was brought to the operative suite and placed on the operative table in the supine position.  IV sedation was induced.  Afrin-soaked pledgets were placed in both sides of the nose for several minutes and then removed.  The fiberoptic laryngoscope was placed into the supraglottic position and was taped to his nose and placed on a Mayo stand above the head.  The neck incision was marked with a marking pen and the neck was prepped and draped in sterile fashion.  The incision was injected with local anesthetic.  The incision was made with a 15 blade scalpel and extended through the subcutaneous and platysma layers using Bovie electrocautery.  A subplatysmal flap was elevated inferiorly, exposing the thyroid cartilage.  Strap muscles were swept off of both sides, exposing the thyroid cartilage perichondrium.  The left ala was approached with the larynx retracted to the right.  The planned window was marked with needle tip cautery.  The window was then made using a 3 mm cutting burr and drill including penetrating the inner perichondrium.  The vocal fold level was confirmed on the endoscopic view.  The Silastic block was then carved to form the implant.  A suture was placed through the implant to allow removal and it was placed through the window.  Placement required a bit of additional drilling of the window and some trimming of the implant.  The implant was again placed and manipulated into position with a very good result both visually and audibly.  The implant was secured with a single 4-0 Nylon suture. ? ?The scope was  removed.  The wound was copiously irrigated with saline.  The strap muscles were loosely closed in the midline using 3-0 Vicryl.  The platysma layer was closed with 3-0 Vicryl in a simple, interrupted fashion.  The skin was closed in the subcutaneous layer with 4-0 Vicryl and the skin was closed with Dermabond.  Drapes were removed and the patient was cleaned off.  The patient was returned to Anesthesia for wake-up and was moved to the recovery room in stable fashion. ?

## 2021-08-08 NOTE — Progress Notes (Signed)
ENT Post Operative Note ? ?Subjective: ?Patient seen and examined at bedside. Reports pain controlled. Tolerating clear liquids.  ? ?Vitals:  ? 08/08/21 1635 08/08/21 1705  ?BP: (!) 155/68 139/70  ?Pulse: (!) 41 (!) 47  ?Resp: 17 18  ?Temp:    ?SpO2: 99% 99%  ? ? ? ?OBJECTIVE  ?Gen: alert, cooperative, appropriate ?Head/ENT: EOMI, mucus membranes moist and pink, conjunctiva clear ?Midline neck incision C/D/I. Neck soft, with no evidence of seroma or hematoma.  ?Respiratory: Voice hoarse but clear.  Non-labored breathing, no accessory muscle use, good O2 saturations on room air ?Neuro: CN II-XII grossly intact ? ?ASSESS/ PLAN ? ?Mary Marks is a 86 y.o. female who is POD 0 from left medialization laryngoplasty. ? ?-Continue observation ?-Continue MIVF- will saline lock when tolerating PO intake ?-Encourage IS use, ambulation to tolerance with assist ?-Pain control ?-Anticipate DC tomorrow if clinically stable ? ?Thank you for allowing me to participate in the care of this patient. Please do not hesitate to contact me with any questions or concerns.  ? ?[place], DO ?Otolaryngology ?[facility] ?Cell: [phone] ? ?

## 2021-08-08 NOTE — Anesthesia Postprocedure Evaluation (Addendum)
Anesthesia Post Note ? ?Patient: [name] ? ?Procedure(s) Performed: LEFT MEDIALIZATION LARYNGOPLASTY (Left: Neck) ? ?  ? ?Patient location during evaluation: PACU ?Anesthesia Type: MAC ?Level of consciousness: awake and alert ?Pain management: pain level controlled ?Vital Signs Assessment: post-procedure vital signs reviewed and stable ?Respiratory status: spontaneous breathing, nonlabored ventilation, respiratory function stable and patient connected to nasal cannula oxygen ?Cardiovascular status: stable, blood pressure returned to baseline and bradycardic ?Postop Assessment: no apparent nausea or vomiting ?Anesthetic complications: no ? ? ?No notable events documented. ? ?Last Vitals:  ?Vitals:  ? 08/08/21 1505 08/08/21 1535  ?BP: 129/74   ?Pulse: (!) 42 (!) 44  ?Resp: 16 16  ?Temp:    ?SpO2: 100% 97%  ?  ?Last Pain:  ?Vitals:  ? 08/08/21 1535  ?PainSc: 0-No pain  ? ? ?  ?  ?  ?  ?  ?  ? ?Shavone Nevers ? ? ? ? ?

## 2021-08-08 NOTE — Brief Op Note (Signed)
08/08/2021 ? ?1:32 PM ? ?PATIENT:  Mary Marks  86 y.o. female ? ?PRE-OPERATIVE DIAGNOSIS:  Complete paralysis of left vocal cord; Hoarseness ? ?POST-OPERATIVE DIAGNOSIS:  Complete paralysis of left vocal cord; Hoarseness ? ?PROCEDURE:  Procedure(s) with comments: ?LEFT MEDIALIZATION LARYNGOPLASTY (Left) - RNFA ? ?SURGEON:  Surgeon(s) and Role: ?   Melida Quitter, MD - Primary ? ?PHYSICIAN ASSISTANT:  ? ?ASSISTANTS: none  ? ?ANESTHESIA:   IV sedation ? ?EBL:  Minimal  ? ?BLOOD ADMINISTERED:none ? ?DRAINS: none  ? ?LOCAL MEDICATIONS USED:  LIDOCAINE  ? ?SPECIMEN:  No Specimen ? ?DISPOSITION OF SPECIMEN:  N/A ? ?COUNTS:  YES ? ?TOURNIQUET:  * No tourniquets in log * ? ?DICTATION: .Note written in EPIC ? ?PLAN OF CARE: Admit for overnight observation ? ?PATIENT DISPOSITION:  PACU - hemodynamically stable. ?  ?Delay start of Pharmacological VTE agent (>24hrs) due to surgical blood loss or risk of bleeding: no ? ?

## 2021-08-08 NOTE — Anesthesia Preprocedure Evaluation (Addendum)
Anesthesia Evaluation  ?Patient identified by MRN, date of birth, ID band ?Patient awake ? ? ? ?Reviewed: ?Allergy & Precautions, NPO status , Patient's Chart, lab work & pertinent test results ? ?History of Anesthesia Complications ?Negative for: history of anesthetic complications ? ?Airway ?Mallampati: IV ? ?TM Distance: <3 FB ?Neck ROM: Full ? ? ? Dental ? ?(+) Dental Advisory Given, Missing,  ?  ?Pulmonary ?shortness of breath, neg sleep apnea, neg COPD, neg recent URI,  ?  ?breath sounds clear to auscultation ? ? ? ? ? ? Cardiovascular ?hypertension, Pt. on medications and Pt. on home beta blockers ?(-) angina(-) Past MI and (-) CHF  ?Rhythm:Regular  ? ?  ?Neuro/Psych ?PSYCHIATRIC DISORDERS Anxiety Depression negative neurological ROS ?   ? GI/Hepatic ?negative GI ROS, Neg liver ROS,   ?Endo/Other  ?Hypothyroidism  ? Renal/GU ?negative Renal ROS  ? ?  ?Musculoskeletal ? ?(+) Arthritis ,  ? Abdominal ?  ?Peds ? Hematology ?negative hematology ROS ?(+) Lab Results ?     Component                Value               Date                 ?     WBC                      6.6                 08/04/2021           ?     HGB                      14.2                08/04/2021           ?     HCT                      43.3                08/04/2021           ?     MCV                      85.6                08/04/2021           ?     PLT                      221                 08/04/2021           ?   ?Anesthesia Other Findings ? ? Reproductive/Obstetrics ? ?  ? ? ? ? ? ? ? ? ? ? ? ? ? ?  ?  ? ? ? ? ? ? ? ? ?Anesthesia Physical ?Anesthesia Plan ? ?ASA: 2 ? ?Anesthesia Plan: MAC  ? ?Post-op Pain Management: Ofirmev IV (intra-op)*  ? ?Induction: Intravenous ? ?PONV Risk Score and Plan: 2 and Ondansetron, Propofol infusion and Treatment may vary due to age or medical condition ? ?Airway Management Planned: Nasal Cannula, Simple Face Mask and Natural Airway ? ?Additional Equipment:  None ? ?Intra-op Plan:  ? ?Post-operative  Plan:  ? ?Informed Consent: I have reviewed the patients History and Physical, chart, labs and discussed the procedure including the risks, benefits and alternatives for the proposed anesthesia with the patient or authorized representative who has indicated his/her understanding and acceptance.  ? ? ? ?Dental advisory given ? ?Plan Discussed with: CRNA ? ?Anesthesia Plan Comments:   ? ? ? ? ? ?Anesthesia Quick Evaluation ? ?

## 2021-08-09 DIAGNOSIS — J3801 Paralysis of vocal cords and larynx, unilateral: Secondary | ICD-10-CM | POA: Diagnosis not present

## 2021-08-09 DIAGNOSIS — Z79899 Other long term (current) drug therapy: Secondary | ICD-10-CM | POA: Diagnosis not present

## 2021-08-09 DIAGNOSIS — Z85828 Personal history of other malignant neoplasm of skin: Secondary | ICD-10-CM | POA: Diagnosis not present

## 2021-08-09 DIAGNOSIS — E039 Hypothyroidism, unspecified: Secondary | ICD-10-CM | POA: Diagnosis not present

## 2021-08-09 DIAGNOSIS — I10 Essential (primary) hypertension: Secondary | ICD-10-CM | POA: Diagnosis not present

## 2021-08-09 DIAGNOSIS — Z7982 Long term (current) use of aspirin: Secondary | ICD-10-CM | POA: Diagnosis not present

## 2021-08-09 MED ORDER — HYDROCODONE-ACETAMINOPHEN 5-325 MG PO TABS: 1.0000 | ORAL_TABLET | Freq: Four times a day (QID) | ORAL | 0 refills | Status: AC | PRN

## 2021-08-09 NOTE — Progress Notes (Signed)
Pt's IV removed, cathter intact. Tele removed, ccmd aware. Pt has all belongings and understands d/c instructions. Pt d/c via wheelchair by RN.  ?

## 2021-08-09 NOTE — Discharge Summary (Signed)
Physician Discharge Summary  ?Patient ID: ?[name] ?MRN: [id number] ?DOB/AGE: 84-May-1937 84 y.o. ? ?Admit date: 08/08/2021 ?Discharge date: 08/09/2021 ? ?Admission Diagnoses:  ?Principal Problem: ?  Vocal cord paralysis ? ? ?Discharge Diagnoses:  ?Same ? ?Surgeries: Procedure(s): ?LEFT MEDIALIZATION LARYNGOPLASTY on 08/08/2021 ?  ?Consultants: None ? ?Discharged Condition: Improved ? ?Hospital Course: Mary Marks is an 86 y.o. female who was admitted 08/08/2021 with  and found to have a diagnosis of Vocal cord paralysis.  They were brought to the operating room on 08/08/2021 and underwent the above named procedures.  Patient was admitted for routine overnight observation. Postop course was uneventful. Patient initially required O2 supplementation via [place] but was successfully weaned off O2 and found to have stable saturations. On POD #1 she reported pain was controlled, and she was tolerating PO without issue.  ? ?Physical Exam: ? ?General: Awake and alert, no acute distress ?Neck: Midline neck incision C/D/I with dermabond overlying incision. Neck soft, with no evidence of seroma or hematoma.  ?Respiratory: Respiratory effort is normal on room air.  ? ?Recent vital signs:  ?Vitals:  ? 08/09/21 0424 08/09/21 0816  ?BP: (!) 134/56 (!) 156/76  ?Pulse: (!) 49 (!) 49  ?Resp: 18 17  ?Temp: 97.6 ?F (36.4 ?C) 98.1 ?F (36.7 ?C)  ?SpO2: 90% 94%  ? ? ?Recent laboratory studies:  ?Results for orders placed or performed during the hospital encounter of 08/04/21  ?SARS CORONAVIRUS 2 (TAT 6-24 HRS) Nasopharyngeal Nasopharyngeal Swab  ? Specimen: Nasopharyngeal Swab  ?Result Value Ref Range  ? SARS Coronavirus 2 NEGATIVE NEGATIVE  ?Basic metabolic panel per protocol  ?Result Value Ref Range  ? Sodium 141 135 - 145 mmol/L  ? Potassium 4.1 3.5 - 5.1 mmol/L  ? Chloride 105 98 - 111 mmol/L  ? CO2 29 22 - 32 mmol/L  ? Glucose, Bld 103 (H) 70 - 99 mg/dL  ? BUN 13 8 - 23 mg/dL  ? Creatinine, Ser 0.81 0.44 - 1.00 mg/dL  ? Calcium 9.3 8.9 - 10.3 mg/dL  ?  GFR, Estimated >60 >60 mL/min  ? Anion gap 7 5 - 15  ?CBC per protocol  ?Result Value Ref Range  ? WBC 6.6 4.0 - 10.5 K/uL  ? RBC 5.06 3.87 - 5.11 MIL/uL  ? Hemoglobin 14.2 12.0 - 15.0 g/dL  ? HCT 43.3 36.0 - 46.0 %  ? MCV 85.6 80.0 - 100.0 fL  ? MCH 28.1 26.0 - 34.0 pg  ? MCHC 32.8 30.0 - 36.0 g/dL  ? RDW 14.2 11.5 - 15.5 %  ? Platelets 221 150 - 400 K/uL  ? nRBC 0.0 0.0 - 0.2 %  ? ? ?Discharge Medications:   ?Allergies as of 08/09/2021   ? ?   Reactions  ? Pravachol [pravastatin] Rash  ? ?  ? ?  ?Medication List  ?  ? ?STOP taking these medications   ? ?celecoxib 100 MG capsule ?Commonly known as: CELEBREX ?  ?Taltz 80 MG/ML Sosy ?Generic drug: Ixekizumab ?  ? ?  ? ?TAKE these medications   ? ?ALPRAZolam 0.5 MG tablet ?Commonly known as: Duanne Moron ?Take 1 tablet (0.5 mg total) by mouth 2 (two) times daily as needed for anxiety. ?What changed: when to take this ?  ?atorvastatin 20 MG tablet ?Commonly known as: LIPITOR ?Take 1 tablet (20 mg total) by mouth daily. ?  ?BC FAST PAIN RELIEF PO ?Take 1 Package by mouth daily as needed (pain). ?  ?busPIRone 5 MG tablet ?Commonly known as: BUSPAR ?  TAKE 1 TABLET (5 MG TOTAL) BY MOUTH 3 (THREE) TIMES DAILY. ?  ?cetirizine 10 MG tablet ?Commonly known as: ZYRTEC ?Take 10 mg by mouth daily. ?  ?cholecalciferol 25 MCG (1000 UNIT) tablet ?Commonly known as: VITAMIN D3 ?Take 1,000 Units by mouth daily. ?  ?clobetasol 0.05 % external solution ?Commonly known as: TEMOVATE ?Apply 1 application topically daily. ?  ?fluticasone 50 MCG/ACT nasal spray ?Commonly known as: FLONASE ?Place 1 spray into both nostrils daily. ?  ?furosemide 20 MG tablet ?Commonly known as: LASIX ?TAKE 1 TABLET EVERY DAY ?What changed:  ?when to take this ?reasons to take this ?  ?HYDROcodone-acetaminophen 5-325 MG tablet ?Commonly known as: NORCO/VICODIN ?Take 1 tablet by mouth every 6 (six) hours as needed for up to 3 days for moderate pain. ?  ?hydrocortisone cream 1 % ?Apply 1 application. topically daily as  needed for itching (psoriasis). ?  ?levothyroxine 112 MCG tablet ?Commonly known as: SYNTHROID ?Take 112 mcg by mouth every morning. ?What changed: Another medication with the same name was removed. Continue taking this medication, and follow the directions you see here. ?  ?lisinopril 40 MG tablet ?Commonly known as: ZESTRIL ?Take 40 mg by mouth daily. ?What changed: Another medication with the same name was removed. Continue taking this medication, and follow the directions you see here. ?  ?metoprolol succinate 100 MG 24 hr tablet ?Commonly known as: TOPROL-XL ?Take 100 mg by mouth daily. ?  ?pregabalin 200 MG capsule ?Commonly known as: Lyrica ?Take 1 capsule (200 mg total) by mouth 2 (two) times daily. ?  ?sertraline 100 MG tablet ?Commonly known as: ZOLOFT ?Take 1 tablet (100 mg total) by mouth daily. ?  ? ?  ? ? ?Diagnostic Studies: No results found. ? ?Disposition: Discharge disposition: 01-Home or Self Care ? ? ? ? ? ? ? ? ? Follow-up Information   ? ? Melida Quitter, MD. Schedule an appointment as soon as possible for a visit in 2 week(s).   ?Specialty: Otolaryngology ?Contact information: ?[address] ?[address] ?[place] [address] ?[phone] ? ? ?  ?  ? ?  ?  ? ?  ? ? ? ?Signed: ?Hajer Dwyer A Nellie Pester ?08/09/2021, 11:11 AM ? ? ?

## 2021-08-11 ENCOUNTER — Encounter (HOSPITAL_COMMUNITY): Payer: Self-pay | Admitting: Otolaryngology

## 2021-10-20 ENCOUNTER — Ambulatory Visit: Payer: Medicare HMO | Admitting: Dermatology

## 2022-03-08 ENCOUNTER — Ambulatory Visit (INDEPENDENT_AMBULATORY_CARE_PROVIDER_SITE_OTHER): Payer: Medicare HMO

## 2022-03-08 ENCOUNTER — Other Ambulatory Visit: Payer: Self-pay

## 2022-03-08 ENCOUNTER — Encounter: Payer: Self-pay | Admitting: Emergency Medicine

## 2022-03-08 ENCOUNTER — Ambulatory Visit
Admission: EM | Admit: 2022-03-08 | Discharge: 2022-03-08 | Disposition: A | Discharge status: Home / Self Care | Payer: Medicare HMO

## 2022-03-08 DIAGNOSIS — R531 Weakness: Secondary | ICD-10-CM

## 2022-03-08 DIAGNOSIS — R059 Cough, unspecified: Secondary | ICD-10-CM

## 2022-03-08 DIAGNOSIS — R21 Rash and other nonspecific skin eruption: Secondary | ICD-10-CM | POA: Diagnosis not present

## 2022-03-08 DIAGNOSIS — J189 Pneumonia, unspecified organism: Secondary | ICD-10-CM | POA: Diagnosis not present

## 2022-03-08 MED ORDER — NYSTATIN 100000 UNIT/GM EX CREA: TOPICAL_CREAM | CUTANEOUS | 0 refills | Status: DC

## 2022-03-08 MED ORDER — GUAIFENESIN ER 600 MG PO TB12: 600.0000 mg | ORAL_TABLET | Freq: Two times a day (BID) | ORAL | 0 refills | Status: DC | PRN

## 2022-03-08 MED ORDER — ALBUTEROL SULFATE HFA 108 (90 BASE) MCG/ACT IN AERS: 2.0000 | INHALATION_SPRAY | RESPIRATORY_TRACT | 0 refills | Status: DC | PRN

## 2022-03-08 MED ORDER — AZITHROMYCIN 250 MG PO TABS: ORAL_TABLET | ORAL | 0 refills | Status: DC

## 2022-03-08 NOTE — ED Triage Notes (Signed)
Pt reports productive cough, generalized weakness, fatigue x1 week.

## 2022-03-08 NOTE — Discharge Instructions (Signed)
Use your incentive spirometer several times daily to keep your lungs nice and inflated.  If you can find a pulse oximeter to buy, make sure to monitor your oxygen saturations at home and we want to keep those ideally over 90%.  Anything below this or any feelings of shortness of breath or worsening symptoms go to the emergency department.  I have sent over antibiotics, Mucinex and an albuterol inhaler to help with your symptoms.

## 2022-03-08 NOTE — ED Notes (Signed)
Pt returned from xray, room air saturation 87%. Re-applied 1 L [place]. O2 saturation 94%.   Consulted PA and discussed to re-assess 02 saturation on different machine. Oxygen removed. Sticker placed, 02 noted to be 94% room air.  Will re-assess and update provider.

## 2022-03-08 NOTE — ED Provider Notes (Signed)
[facility]    CSN: [id number] Arrival date & time: 03/08/22  1012      History   Chief Complaint Chief Complaint  Patient presents with   Cough    HPI Mary Marks is a 86 y.o. female.   Patient presenting today with 1 week history of hacking productive cough, generalized weakness, fatigue.  Denies chest pain, shortness of breath, wheezing, abdominal pain, nausea vomiting or diarrhea.  So far trying some Mucinex but states it made it worse so she stopped doing that and has not been taking anything for her symptoms.  No known history of past chronic pulmonary issues.    Past Medical History:  Diagnosis Date   Anxiety    Arthritis    in knees   Basal cell carcinoma 07/26/2019   nodular on right lower back - CX3+5FU   Cancer (HCC)    Melanoma on back   Depression    Dyspnea    Hyperlipidemia    Hypertension    Hypothyroidism    Melanoma ([place])    left post shoulder   Squamous cell carcinoma of skin 03/20/1999   well diff-lower bulb nose  (txpb)   Thyroid disease    Hypothyroid    Patient Active Problem List   Diagnosis Date Noted   Vocal cord paralysis 08/08/2021   Psoriasis 06/29/2019   Vitamin D deficiency 05/12/2019   Anxiety 05/12/2019   DDD (degenerative disc disease), lumbar 05/12/2019   Hypertension    Hypothyroidism    Depression    Hyperlipidemia    Osteoarthritis of knee 01/24/2018   Osteoarthritis of left knee 06/15/2017    Past Surgical History:  Procedure Laterality Date   DILATION AND CURETTAGE OF UTERUS     EXCISION OF BACK LESION     Melanoma   LARYNGOPLASTY Left 08/08/2021   Procedure: LEFT MEDIALIZATION LARYNGOPLASTY;  Surgeon: Melida Quitter, MD;  Location: [facility];  Service: ENT;  Laterality: Left;  [place]    OB History   No obstetric history on file.      Home Medications    Prior to Admission medications   Medication Sig Start Date End Date Taking? Authorizing Provider  albuterol (VENTOLIN HFA)  108 (90 Base) MCG/ACT inhaler Inhale 2 puffs into the lungs every 4 (four) hours as needed for wheezing or shortness of breath. 03/08/22  Yes Volney American, PA-C  azithromycin (ZITHROMAX) 250 MG tablet Take first 2 tablets together, then 1 every day until finished. 03/08/22  Yes Volney American, PA-C  guaiFENesin (MUCINEX) 600 MG 12 hr tablet Take 1 tablet (600 mg total) by mouth 2 (two) times daily as needed. 03/08/22  Yes Volney American, PA-C  methotrexate (RHEUMATREX) 2.5 MG tablet Take 2.5 mg by mouth once a week. Caution:Chemotherapy. Protect from light.  Reports takes 4 pills weekly. Did not take dose on Saturday.   Yes [provider]  nystatin cream (MYCOSTATIN) Apply to affected area 2 times daily 03/08/22  Yes Volney American, PA-C  ALPRAZolam Duanne Moron) 0.5 MG tablet Take 1 tablet (0.5 mg total) by mouth 2 (two) times daily as needed for anxiety. Patient taking differently: Take 0.5 mg by mouth 3 (three) times daily. 09/08/19   Evelina Dun A, FNP  Aspirin-Caffeine (BC FAST PAIN RELIEF PO) Take 1 Package by mouth daily as needed (pain).    [provider]  atorvastatin (LIPITOR) 20 MG tablet Take 1 tablet (20 mg total) by mouth daily.  06/29/19   Hawks, Alyse Low A, FNP  busPIRone (BUSPAR) 5 MG tablet TAKE 1 TABLET (5 MG TOTAL) BY MOUTH 3 (THREE) TIMES DAILY. Patient not taking: Reported on 05/14/2021 11/06/19   Sharion Balloon, FNP  cetirizine (ZYRTEC) 10 MG tablet Take 10 mg by mouth daily. 10/25/19   [provider]  cholecalciferol (VITAMIN D3) 25 MCG (1000 UNIT) tablet Take 1,000 Units by mouth daily.    [provider]  clobetasol (TEMOVATE) 0.05 % external solution Apply 1 application topically daily. Patient not taking: Reported on 07/29/2021 03/19/20   Lavonna Monarch, MD  fluticasone [place]) 50 MCG/ACT nasal spray Place 1 spray into both nostrils daily. 11/03/19   Sharion Balloon, FNP  furosemide (LASIX) 20 MG tablet TAKE 1  TABLET EVERY DAY Patient taking differently: Take 20 mg by mouth daily as needed for edema. 11/06/19   Evelina Dun A, FNP  hydrocortisone cream 1 % Apply 1 application. topically daily as needed for itching (psoriasis).    [provider]  levothyroxine (SYNTHROID) 112 MCG tablet Take 112 mcg by mouth every morning. 07/14/21   [provider]  lisinopril (ZESTRIL) 40 MG tablet Take 40 mg by mouth daily. 02/09/20   [provider]  metoprolol succinate (TOPROL-XL) 100 MG 24 hr tablet Take 100 mg by mouth daily.    [provider]  pregabalin (LYRICA) 200 MG capsule Take 1 capsule (200 mg total) by mouth 2 (two) times daily. 06/29/19   Evelina Dun A, FNP  sertraline (ZOLOFT) 100 MG tablet Take 1 tablet (100 mg total) by mouth daily. 06/29/19   Sharion Balloon, FNP    Family History History reviewed. No pertinent family history.  Social History Social History   Tobacco Use   Smoking status: Never   Smokeless tobacco: Never  Vaping Use   Vaping Use: Never used  Substance Use Topics   Alcohol use: Never   Drug use: Never     Allergies   Pravachol [pravastatin]   Review of Systems Review of Systems Per HPI  Physical Exam Triage Vital Signs ED Triage Vitals [03/08/22 1117]  Enc Vitals Group     BP (!) 171/89     Pulse Rate 74     Resp (!) 22     Temp 99.2 F (37.3 C)     Temp Source Oral     SpO2 (!) 88 %     Weight      Height      Head Circumference      Peak Flow      Pain Score 6     Pain Loc      Pain Edu?      Excl. in [place]?    No data found.  Updated Vital Signs BP (!) 171/89 (BP Location: Right Arm)   Pulse 74   Temp 99.2 F (37.3 C) (Oral)   Resp (!) 22   SpO2 96%   Visual Acuity Right Eye Distance:   Left Eye Distance:   Bilateral Distance:    Right Eye Near:   Left Eye Near:    Bilateral Near:     Physical Exam Vitals and nursing note reviewed.  Constitutional:      Appearance: Normal appearance.   HENT:     Head: Atraumatic.     Right Ear: Tympanic membrane and external ear normal.     Left Ear: Tympanic membrane and external ear normal.     Nose: Congestion present.  Mouth/Throat:     Mouth: Mucous membranes are moist.     Pharynx: Posterior oropharyngeal erythema present.  Eyes:     Extraocular Movements: Extraocular movements intact.     Conjunctiva/sclera: Conjunctivae normal.  Cardiovascular:     Rate and Rhythm: Normal rate and regular rhythm.     Heart sounds: Normal heart sounds.  Pulmonary:     Effort: Pulmonary effort is normal.     Breath sounds: No wheezing or rales.     Comments: Mildly decreased breath sounds throughout Musculoskeletal:     Cervical back: Normal range of motion and neck supple.     Comments: In wheelchair currently due to weakness, ambulatory at baseline  Skin:    General: Skin is warm.     Findings: Rash present.     Comments: Erythematous macerated rash in fold of pannus right lower abdomen  Neurological:     Mental Status: She is alert and oriented to person, place, and time.  Psychiatric:        Mood and Affect: Mood normal.        Thought Content: Thought content normal.      UC Treatments / Results  Labs (all labs ordered are listed, but only abnormal results are displayed) Labs Reviewed - No data to display  EKG   Radiology DG Chest 2 View  Result Date: 03/08/2022 CLINICAL DATA:  Cough, weakness. EXAM: CHEST - 2 VIEW COMPARISON:  06/19/2021 FINDINGS: Cardiomegaly. Coarsened interstitial markings within the bibasilar and right perihilar regions. No pleural effusion or pneumothorax. Chronic inferior endplate compression fracture of T12. IMPRESSION: Coarsened interstitial markings within the bibasilar and right perihilar regions, which may reflect bronchitic lung changes a versus atypical/viral infection. Electronically Signed   By: Davina Poke D.O.   On: 03/08/2022 12:20    Procedures Procedures (including critical  care time)  Medications Ordered in UC Medications - No data to display  Initial Impression / Assessment and Plan / UC Course  I have reviewed the triage vital signs and the nursing notes.  Pertinent labs & imaging results that were available during my care of the patient were reviewed by me and considered in my medical decision making (see chart for details).     Patient borderline hypoxic throughout time in clinic today, ranging from 87% to 94% on room air so was placed on 2 L of oxygen via nasal cannula for most of visit for further support.  Did discuss with patient that she is breathing very shallowly, likely to inhibit coughing spells but that she needs to take big deep breaths and use an incentive spirometer at home to help support full inflation.  Her chest x-ray today is showing possible atypical pneumonia so will cover with azithromycin, Mucinex, albuterol inhaler.  She also shows me a rash during exam which she states started from her psoriasis but her pants have been rubbing it and she feels it might now be infected.  The azithromycin should help with any skin infection but also appears macerated and possibly a secondary yeast infection so will cover with nystatin cream additionally.  Follow-up with PCP for recheck next week of both issues.  Final Clinical Impressions(s) / UC Diagnoses   Final diagnoses:  Atypical pneumonia  Rash     Discharge Instructions      Use your incentive spirometer several times daily to keep your lungs nice and inflated.  If you can find a pulse oximeter to buy, make sure to monitor your oxygen saturations  at home and we want to keep those ideally over 90%.  Anything below this or any feelings of shortness of breath or worsening symptoms go to the emergency department.  I have sent over antibiotics, Mucinex and an albuterol inhaler to help with your symptoms.    ED Prescriptions     Medication Sig Dispense Auth. Provider   azithromycin (ZITHROMAX)  250 MG tablet Take first 2 tablets together, then 1 every day until finished. 6 tablet Volney American, PA-C   guaiFENesin (MUCINEX) 600 MG 12 hr tablet Take 1 tablet (600 mg total) by mouth 2 (two) times daily as needed. 30 tablet Volney American, [place]   albuterol (VENTOLIN HFA) 108 (90 Base) MCG/ACT inhaler Inhale 2 puffs into the lungs every 4 (four) hours as needed for wheezing or shortness of breath. [place], [place]   nystatin cream (MYCOSTATIN) Apply to affected area 2 times daily 80 g Volney American, [place]      PDMP not reviewed this encounter.   Volney American, [place] 03/08/22 1350

## 2022-03-08 NOTE — ED Notes (Signed)
O2 saturation maintaining 92-93% on room air. NAD noted. PA aware and at bedside.

## 2022-04-04 ENCOUNTER — Other Ambulatory Visit: Payer: Self-pay | Admitting: Family Medicine

## 2022-04-04 NOTE — Telephone Encounter (Signed)
No longer under prescribers care R. Orene Desanctis 03/08/22  Requested Prescriptions  Refused Prescriptions Disp Refills   albuterol (VENTOLIN HFA) 108 (90 Base) MCG/ACT inhaler [Pharmacy Med Name: ALBUTEROL HFA (VENTOLIN) INH] 18 each     Sig: INHALE 2 PUFFS INTO THE LUNGS EVERY 4 HOURS AS NEEDED FOR WHEEZING OR SHORTNESS OF BREATH.     There is no refill protocol information for this order

## 2022-05-18 ENCOUNTER — Ambulatory Visit: Payer: Medicare HMO | Admitting: Dermatology

## 2022-07-13 ENCOUNTER — Other Ambulatory Visit: Payer: Self-pay | Admitting: Family Medicine

## 2022-07-14 NOTE — Telephone Encounter (Signed)
Unable to refill per protocol, last refill by another provider not at this practice.  Requested Prescriptions  Pending Prescriptions Disp Refills   nystatin cream (MYCOSTATIN) [Pharmacy Med Name: NYSTATIN 100,000 UNIT/GM CREAM] 75 g     Sig: APPLY TO AFFECTED AREA TWICE A DAY     There is no refill protocol information for this order

## 2022-08-15 ENCOUNTER — Other Ambulatory Visit: Payer: Self-pay | Admitting: Family Medicine

## 2022-08-17 NOTE — Telephone Encounter (Signed)
Unable to refill per protocol, last refill by another provider not at this practice.  Requested Prescriptions  Pending Prescriptions Disp Refills   nystatin cream (MYCOSTATIN) [Pharmacy Med Name: NYSTATIN 100,000 UNIT/GM CREAM] 75 g     Sig: APPLY TO AFFECTED AREA TWICE A DAY     There is no refill protocol information for this order      

## 2023-11-08 IMAGING — CT CT NECK W/O CM
2 of 3 series · 8 of 14 positions shown, 9 images · non-contrast
Comparison: Chest CT which is reported separately

CLINICAL DATA: Complete paralysis of the left focal cord.

EXAM:
CT NECK WITHOUT CONTRAST
TECHNIQUE: Multidetector CT imaging of the neck was performed following the
standard protocol without intravenous contrast.
RADIATION DOSE REDUCTION: This exam was performed according to the
departmental dose-optimization program which includes automated
exposure control, adjustment of the mA and/or kV according to
patient size and/or use of iterative reconstruction technique.

[Series 3: neck · axial · 0.51mm/px · z∈[-245,-109]mm · 4 of 114 slices shown, 5 images]
[im 23/114  soft-tissue]
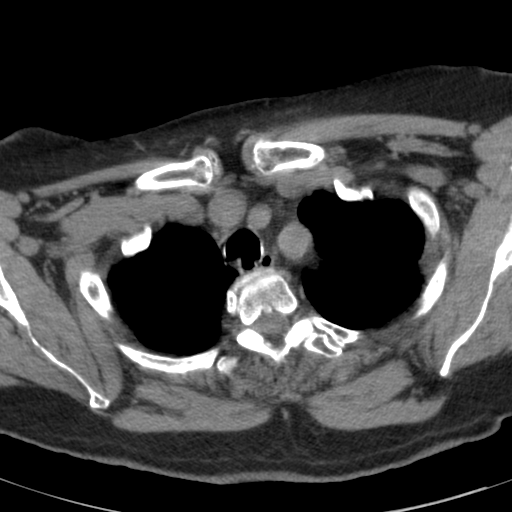
[im 23/114  bone]
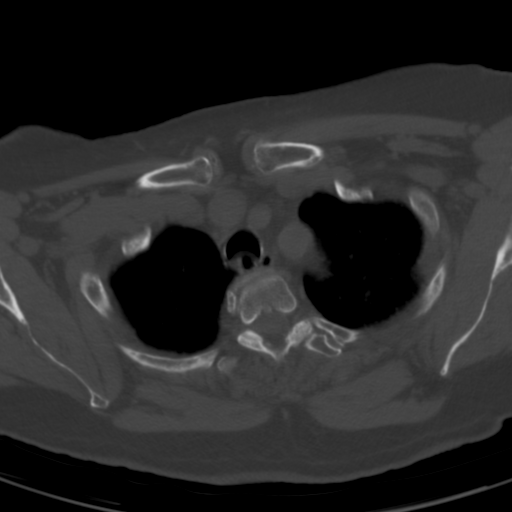
[im 46/114  bone]
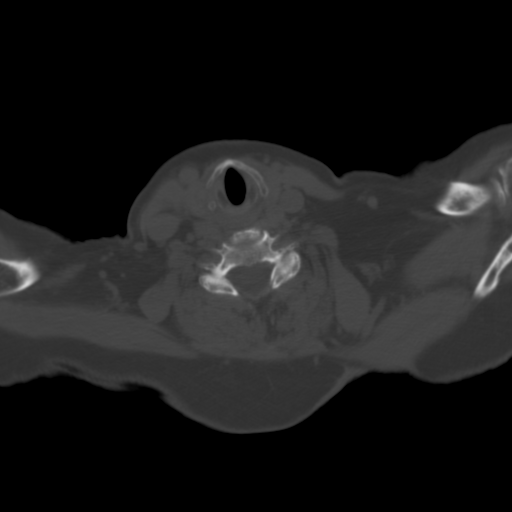
[im 68/114  bone]
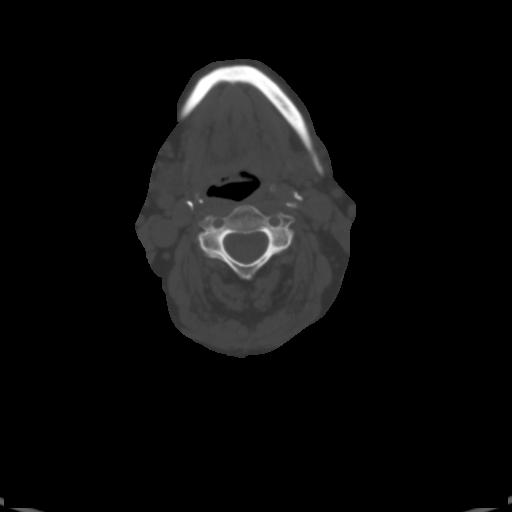
[im 91/114  bone]
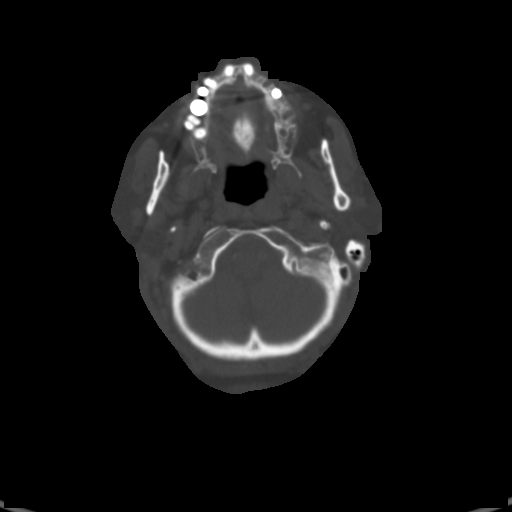

[Series 6: angled axial-oropharynx · axial · 0.39mm/px · z∈[-261,-129]mm · 4 of 114 slices shown]
[im 23/114  bone]
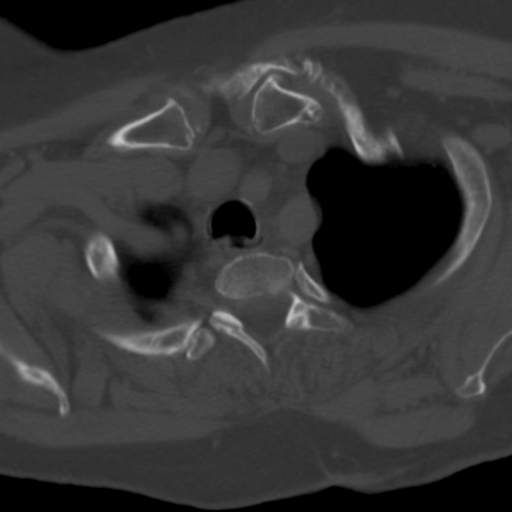
[im 46/114  bone]
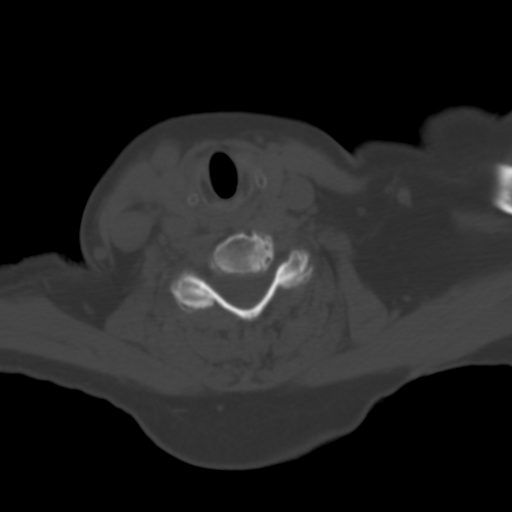
[im 68/114  bone]
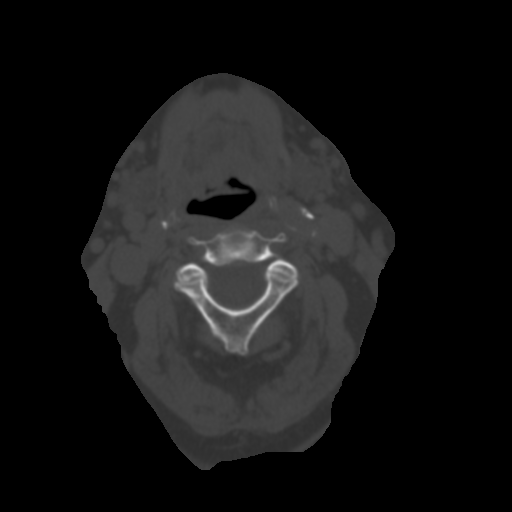
[im 91/114  bone]
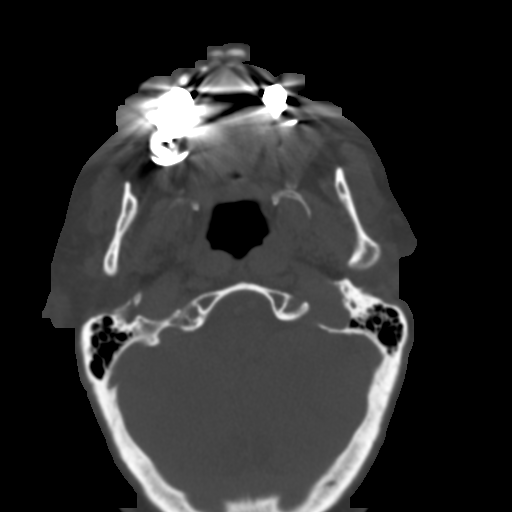

[8 of 14 positions shown; findings below may reference images not displayed]

FINDINGS: Pharynx and larynx: History of left local cord paresis with
associated left laryngeal ventricle and piriform sinus enlargement.
Mild motion artifact at the level of the larynx limits detection of
associated atrophy. No inflammation or mass is seen along the course
of the recurrent laryngeal nerve.

Salivary glands: No inflammation, mass, or stone.

Thyroid: Normal.

Lymph nodes: None enlarged or abnormal density.

Vascular: Atheromatous calcifications especially at the carotid
bifurcations.

Limited intracranial: Negative for acute finding. Small remote left
cerebellar infarcts.

Visualized orbits: Bilateral cataract resection.

Mastoids and visualized paranasal sinuses: Mucosal thickening, wall
sclerosis, and atelectasis at the right maxillary sinus. Sinus
disease was also described on a 1713 head CT.

Skeleton: Ordinary degenerative changes in the cervical spine

Upper chest: Reported separately
IMPRESSION: 1. Laryngeal asymmetry correlating with history of left paresis. No
visible cause in the neck.
2. Chest CT reported separately.

## 2024-01-03 DIAGNOSIS — R2240 Localized swelling, mass and lump, unspecified lower limb: Secondary | ICD-10-CM | POA: Insufficient documentation

## 2024-03-13 ENCOUNTER — Other Ambulatory Visit: Payer: Self-pay

## 2024-03-13 ENCOUNTER — Encounter (HOSPITAL_COMMUNITY): Payer: Self-pay | Admitting: Emergency Medicine

## 2024-03-13 ENCOUNTER — Inpatient Hospital Stay (HOSPITAL_COMMUNITY)

## 2024-03-13 ENCOUNTER — Inpatient Hospital Stay (HOSPITAL_COMMUNITY)
Admission: EM | Admit: 2024-03-13 | Discharge: 2024-03-21 | DRG: 521 | Disposition: A | Discharge status: Skilled Nursing Facility | Attending: Hospitalist | Admitting: Hospitalist

## 2024-03-13 ENCOUNTER — Emergency Department (HOSPITAL_COMMUNITY)

## 2024-03-13 DIAGNOSIS — F419 Anxiety disorder, unspecified: Secondary | ICD-10-CM | POA: Diagnosis present

## 2024-03-13 DIAGNOSIS — W19XXXA Unspecified fall, initial encounter: Secondary | ICD-10-CM

## 2024-03-13 DIAGNOSIS — W07XXXA Fall from chair, initial encounter: Secondary | ICD-10-CM | POA: Diagnosis present

## 2024-03-13 DIAGNOSIS — E876 Hypokalemia: Secondary | ICD-10-CM | POA: Diagnosis present

## 2024-03-13 DIAGNOSIS — Z85828 Personal history of other malignant neoplasm of skin: Secondary | ICD-10-CM | POA: Diagnosis not present

## 2024-03-13 DIAGNOSIS — I5030 Unspecified diastolic (congestive) heart failure: Secondary | ICD-10-CM | POA: Diagnosis not present

## 2024-03-13 DIAGNOSIS — E785 Hyperlipidemia, unspecified: Secondary | ICD-10-CM | POA: Diagnosis present

## 2024-03-13 DIAGNOSIS — I50810 Right heart failure, unspecified: Secondary | ICD-10-CM | POA: Diagnosis not present

## 2024-03-13 DIAGNOSIS — I110 Hypertensive heart disease with heart failure: Secondary | ICD-10-CM | POA: Diagnosis present

## 2024-03-13 DIAGNOSIS — Z888 Allergy status to other drugs, medicaments and biological substances status: Secondary | ICD-10-CM | POA: Diagnosis not present

## 2024-03-13 DIAGNOSIS — Y92008 Other place in unspecified non-institutional (private) residence as the place of occurrence of the external cause: Secondary | ICD-10-CM

## 2024-03-13 DIAGNOSIS — E039 Hypothyroidism, unspecified: Secondary | ICD-10-CM | POA: Diagnosis present

## 2024-03-13 DIAGNOSIS — E874 Mixed disorder of acid-base balance: Secondary | ICD-10-CM | POA: Diagnosis present

## 2024-03-13 DIAGNOSIS — S72001A Fracture of unspecified part of neck of right femur, initial encounter for closed fracture: Principal | ICD-10-CM | POA: Diagnosis present

## 2024-03-13 DIAGNOSIS — F418 Other specified anxiety disorders: Secondary | ICD-10-CM | POA: Diagnosis not present

## 2024-03-13 DIAGNOSIS — G9340 Encephalopathy, unspecified: Secondary | ICD-10-CM | POA: Diagnosis not present

## 2024-03-13 DIAGNOSIS — G629 Polyneuropathy, unspecified: Secondary | ICD-10-CM | POA: Diagnosis present

## 2024-03-13 DIAGNOSIS — I959 Hypotension, unspecified: Secondary | ICD-10-CM | POA: Diagnosis present

## 2024-03-13 DIAGNOSIS — I5033 Acute on chronic diastolic (congestive) heart failure: Secondary | ICD-10-CM | POA: Diagnosis present

## 2024-03-13 DIAGNOSIS — E861 Hypovolemia: Secondary | ICD-10-CM | POA: Diagnosis present

## 2024-03-13 DIAGNOSIS — I517 Cardiomegaly: Secondary | ICD-10-CM | POA: Diagnosis not present

## 2024-03-13 DIAGNOSIS — D696 Thrombocytopenia, unspecified: Secondary | ICD-10-CM | POA: Diagnosis present

## 2024-03-13 DIAGNOSIS — J690 Pneumonitis due to inhalation of food and vomit: Secondary | ICD-10-CM | POA: Diagnosis present

## 2024-03-13 DIAGNOSIS — F32A Depression, unspecified: Secondary | ICD-10-CM | POA: Diagnosis present

## 2024-03-13 DIAGNOSIS — J9601 Acute respiratory failure with hypoxia: Secondary | ICD-10-CM | POA: Diagnosis present

## 2024-03-13 DIAGNOSIS — Z85820 Personal history of malignant melanoma of skin: Secondary | ICD-10-CM

## 2024-03-13 DIAGNOSIS — Z79899 Other long term (current) drug therapy: Secondary | ICD-10-CM

## 2024-03-13 DIAGNOSIS — I272 Pulmonary hypertension, unspecified: Secondary | ICD-10-CM | POA: Diagnosis present

## 2024-03-13 DIAGNOSIS — D62 Acute posthemorrhagic anemia: Secondary | ICD-10-CM | POA: Diagnosis not present

## 2024-03-13 DIAGNOSIS — M179 Osteoarthritis of knee, unspecified: Secondary | ICD-10-CM | POA: Diagnosis present

## 2024-03-13 DIAGNOSIS — I10 Essential (primary) hypertension: Secondary | ICD-10-CM | POA: Diagnosis present

## 2024-03-13 DIAGNOSIS — Z79890 Hormone replacement therapy (postmenopausal): Secondary | ICD-10-CM

## 2024-03-13 DIAGNOSIS — Z7982 Long term (current) use of aspirin: Secondary | ICD-10-CM

## 2024-03-13 DIAGNOSIS — Z66 Do not resuscitate: Secondary | ICD-10-CM | POA: Diagnosis present

## 2024-03-13 DIAGNOSIS — I440 Atrioventricular block, first degree: Secondary | ICD-10-CM | POA: Diagnosis present

## 2024-03-13 LAB — TSH: TSH: 3.6 u[IU]/mL (ref 0.350–4.500)

## 2024-03-13 LAB — CBC WITH DIFFERENTIAL/PLATELET
Abs Immature Granulocytes: 0.05 K/uL (ref 0.00–0.07)
Basophils Absolute: 0 K/uL (ref 0.0–0.1)
Basophils Relative: 0 %
Eosinophils Absolute: 0.1 K/uL (ref 0.0–0.5)
Eosinophils Relative: 0 %
HCT: 45 % (ref 36.0–46.0)
Hemoglobin: 14.7 g/dL (ref 12.0–15.0)
Immature Granulocytes: 0 %
Lymphocytes Relative: 12 %
Lymphs Abs: 1.4 K/uL (ref 0.7–4.0)
MCH: 28.5 pg (ref 26.0–34.0)
MCHC: 32.7 g/dL (ref 30.0–36.0)
MCV: 87.4 fL (ref 80.0–100.0)
Monocytes Absolute: 0.7 K/uL (ref 0.1–1.0)
Monocytes Relative: 6 %
Neutro Abs: 9.3 K/uL — ABNORMAL HIGH (ref 1.7–7.7)
Neutrophils Relative %: 82 %
Platelets: 145 K/uL — ABNORMAL LOW (ref 150–400)
RBC: 5.15 MIL/uL — ABNORMAL HIGH (ref 3.87–5.11)
RDW: 14.6 % (ref 11.5–15.5)
WBC: 11.5 K/uL — ABNORMAL HIGH (ref 4.0–10.5)
nRBC: 0 % (ref 0.0–0.2)

## 2024-03-13 LAB — TYPE AND SCREEN
ABO/RH(D): A POS
Antibody Screen: NEGATIVE

## 2024-03-13 LAB — CK: Total CK: 120 U/L (ref 38–234)

## 2024-03-13 LAB — BASIC METABOLIC PANEL WITH GFR
Anion gap: 15 (ref 5–15)
BUN: 17 mg/dL (ref 8–23)
CO2: 22 mmol/L (ref 22–32)
Calcium: 8.9 mg/dL (ref 8.9–10.3)
Chloride: 101 mmol/L (ref 98–111)
Creatinine, Ser: 0.69 mg/dL (ref 0.44–1.00)
GFR, Estimated: >60 mL/min (ref >60)
Glucose, Bld: 130 mg/dL — ABNORMAL HIGH (ref 70–99)
Potassium: 3.8 mmol/L (ref 3.5–5.1)
Sodium: 138 mmol/L (ref 135–145)

## 2024-03-13 LAB — SURGICAL PCR SCREEN
MRSA, PCR: NEGATIVE
Staphylococcus aureus: NEGATIVE

## 2024-03-13 LAB — VITAMIN D 25 HYDROXY (VIT D DEFICIENCY, FRACTURES): Vit D, 25-Hydroxy: 22.32 ng/mL — ABNORMAL LOW (ref 30–100)

## 2024-03-13 LAB — PROTIME-INR
INR: 1.3 — ABNORMAL HIGH (ref 0.8–1.2)
Prothrombin Time: 16.8 s — ABNORMAL HIGH (ref 11.4–15.2)

## 2024-03-13 LAB — PRO BRAIN NATRIURETIC PEPTIDE: Pro Brain Natriuretic Peptide: 8312 pg/mL — ABNORMAL HIGH (ref <300.0)

## 2024-03-13 MED ORDER — TRANEXAMIC ACID-NACL 1000-0.7 MG/100ML-% IV SOLN
1000.0000 mg | INTRAVENOUS | Status: AC
  Administered 2024-03-14: 1000 mg via INTRAVENOUS
  Filled 2024-03-13: qty 100

## 2024-03-13 MED ORDER — SODIUM CHLORIDE 0.9 % IV SOLN: INTRAVENOUS | Status: DC

## 2024-03-13 MED ORDER — POVIDONE-IODINE 10 % EX SWAB: 2.0000 | Freq: Once | CUTANEOUS | Status: DC

## 2024-03-13 MED ORDER — ATORVASTATIN CALCIUM 20 MG PO TABS
20.0000 mg | ORAL_TABLET | Freq: Every day | ORAL | Status: DC
  Administered 2024-03-15 – 2024-03-21 (×6): 20 mg via ORAL
  Filled 2024-03-13 (×8): qty 1

## 2024-03-13 MED ORDER — PREGABALIN 75 MG PO CAPS
200.0000 mg | ORAL_CAPSULE | Freq: Two times a day (BID) | ORAL | Status: DC
  Administered 2024-03-13 – 2024-03-18 (×9): 200 mg via ORAL
  Filled 2024-03-13 (×2): qty 2
  Filled 2024-03-13: qty 1
  Filled 2024-03-13 (×2): qty 2
  Filled 2024-03-13: qty 1
  Filled 2024-03-13: qty 2
  Filled 2024-03-13: qty 1
  Filled 2024-03-13: qty 2

## 2024-03-13 MED ORDER — ONDANSETRON HCL 4 MG PO TABS: 4.0000 mg | ORAL_TABLET | Freq: Four times a day (QID) | ORAL | Status: DC | PRN

## 2024-03-13 MED ORDER — ORAL CARE MOUTH RINSE: 15.0000 mL | OROMUCOSAL | Status: DC | PRN

## 2024-03-13 MED ORDER — SODIUM CHLORIDE 0.9 % IV SOLN
3.0000 g | Freq: Four times a day (QID) | INTRAVENOUS | Status: DC
  Administered 2024-03-13 – 2024-03-17 (×14): 3 g via INTRAVENOUS
  Filled 2024-03-13 (×4): qty 8
  Filled 2024-03-13: qty 3
  Filled 2024-03-13 (×3): qty 8
  Filled 2024-03-13: qty 3
  Filled 2024-03-13 (×11): qty 8

## 2024-03-13 MED ORDER — LEVOTHYROXINE SODIUM 112 MCG PO TABS
112.0000 ug | ORAL_TABLET | Freq: Every morning | ORAL | Status: DC
  Administered 2024-03-14 – 2024-03-21 (×8): 112 ug via ORAL
  Filled 2024-03-13 (×8): qty 1

## 2024-03-13 MED ORDER — CHLORHEXIDINE GLUCONATE 4 % EX SOLN
60.0000 mL | Freq: Once | CUTANEOUS | Status: AC
  Administered 2024-03-14: 4 via TOPICAL

## 2024-03-13 MED ORDER — MORPHINE SULFATE (PF) 4 MG/ML IV SOLN
4.0000 mg | INTRAVENOUS | Status: DC | PRN
  Administered 2024-03-13 (×2): 4 mg via INTRAVENOUS
  Filled 2024-03-13 (×2): qty 1

## 2024-03-13 MED ORDER — INFLUENZA VAC SPLIT HIGH-DOSE 0.5 ML IM SUSY: 0.5000 mL | PREFILLED_SYRINGE | INTRAMUSCULAR | Status: DC | PRN

## 2024-03-13 MED ORDER — VITAMIN D 25 MCG (1000 UNIT) PO TABS
1000.0000 [IU] | ORAL_TABLET | Freq: Every day | ORAL | Status: DC
  Administered 2024-03-13 – 2024-03-21 (×7): 1000 [IU] via ORAL
  Filled 2024-03-13 (×9): qty 1

## 2024-03-13 MED ORDER — MORPHINE SULFATE (PF) 2 MG/ML IV SOLN
2.0000 mg | INTRAVENOUS | Status: DC | PRN
  Administered 2024-03-14: 2 mg via INTRAVENOUS
  Filled 2024-03-13: qty 1

## 2024-03-13 MED ORDER — SODIUM CHLORIDE 0.9% FLUSH
3.0000 mL | Freq: Two times a day (BID) | INTRAVENOUS | Status: DC
  Administered 2024-03-13 – 2024-03-20 (×14): 3 mL via INTRAVENOUS

## 2024-03-13 MED ORDER — ACETAMINOPHEN 650 MG RE SUPP: 650.0000 mg | Freq: Four times a day (QID) | RECTAL | Status: DC | PRN

## 2024-03-13 MED ORDER — HEPARIN SODIUM (PORCINE) 5000 UNIT/ML IJ SOLN
5000.0000 [IU] | Freq: Three times a day (TID) | INTRAMUSCULAR | Status: DC
  Administered 2024-03-13 – 2024-03-14 (×2): 5000 [IU] via SUBCUTANEOUS
  Filled 2024-03-13 (×2): qty 1

## 2024-03-13 MED ORDER — ONDANSETRON HCL 4 MG/2ML IJ SOLN
4.0000 mg | Freq: Four times a day (QID) | INTRAMUSCULAR | Status: DC | PRN
  Administered 2024-03-15: 4 mg via INTRAVENOUS
  Filled 2024-03-13: qty 2

## 2024-03-13 MED ORDER — CEFAZOLIN SODIUM-DEXTROSE 2-4 GM/100ML-% IV SOLN
2.0000 g | INTRAVENOUS | Status: AC
  Administered 2024-03-14: 2 g via INTRAVENOUS
  Filled 2024-03-13 (×2): qty 100

## 2024-03-13 MED ORDER — SERTRALINE HCL 50 MG PO TABS
100.0000 mg | ORAL_TABLET | Freq: Every day | ORAL | Status: DC
  Administered 2024-03-15 – 2024-03-21 (×6): 100 mg via ORAL
  Filled 2024-03-13 (×8): qty 2

## 2024-03-13 MED ORDER — HYDRALAZINE HCL 20 MG/ML IJ SOLN: 10.0000 mg | INTRAMUSCULAR | Status: DC | PRN

## 2024-03-13 MED ORDER — ACETAMINOPHEN 325 MG PO TABS: 650.0000 mg | ORAL_TABLET | Freq: Four times a day (QID) | ORAL | Status: DC | PRN

## 2024-03-13 MED ORDER — ALPRAZOLAM 0.5 MG PO TABS: 0.5000 mg | ORAL_TABLET | Freq: Three times a day (TID) | ORAL | Status: DC | PRN

## 2024-03-13 MED ORDER — LISINOPRIL 10 MG PO TABS
40.0000 mg | ORAL_TABLET | Freq: Every day | ORAL | Status: DC
  Filled 2024-03-13 (×2): qty 4

## 2024-03-13 MED ORDER — IOHEXOL 350 MG/ML SOLN
75.0000 mL | Freq: Once | INTRAVENOUS | Status: AC | PRN
  Administered 2024-03-13: 75 mL via INTRAVENOUS

## 2024-03-13 NOTE — ED Notes (Signed)
 This nurse and nurse tech cleansed pt with warm soapy rags to remove stool from pt. Brief placed, as well as chuck pad underneath pt.

## 2024-03-13 NOTE — ED Notes (Signed)
 Patient transported to CT

## 2024-03-13 NOTE — ED Triage Notes (Signed)
 Pt bib rcems for a fall. Pt fell at home alone last night around 1800 per family who witnessed fall on indoor camera this morning. Pt laid their until 1030 this morning. Pt c/o of right sided hip pain. Hip appears to be shortened and rotated. Strong pulses noted. Pt also requring 4L of O2. Pt sats were 83% on RA

## 2024-03-13 NOTE — H&P (Signed)
 History and Physical    Patient: Mary Marks FMW:[id number] DOB: Jul 10, 1935 DOA: 03/13/2024 DOS: the patient was seen and examined on 03/13/2024 PCP: [name] [name], FNP  Patient coming from: Home  Chief Complaint:  Chief Complaint  Patient presents with   Fall   HPI: Mary Marks is an 88 y.o. female with medical history significant of OA, HTN, HLD, hypothyroidism, melanoma who presented to the ED from home where she had fallen. Her son supplements history, found the patient on the ground this morning and review of home camera shows she fell last night. Pt doesn't recall events leading to fall but was too weak to get herself back up off the floor. She had pain in the right hip/leg. Reports she has chronic runny nose and mild cough but no recent changes. No shortness of breath but was hypoxic in the ED. Denies chest pain, sick contacts, wheezing.   In the ED she was found to have hypoxia with negative CXR and displaced right femoral neck fracture. Orthopedics consulted, advised admission at [facility] for repair 12/9.   Review of Systems: As mentioned in the history of present illness. All other systems reviewed and are negative. Past Medical History:  Diagnosis Date   Anxiety    Arthritis    in knees   Basal cell carcinoma 07/26/2019   nodular on right lower back - CX3+5FU   Cancer (HCC)    Melanoma on back   Depression    Dyspnea    Hyperlipidemia    Hypertension    Hypothyroidism    Melanoma (HCC)    left post shoulder   Squamous cell carcinoma of skin 03/20/1999   well diff-lower bulb nose  (txpb)   Thyroid  disease    Hypothyroid   Past Surgical History:  Procedure Laterality Date   DILATION AND CURETTAGE OF UTERUS     EXCISION OF BACK LESION     Melanoma   LARYNGOPLASTY Left 08/08/2021   Procedure: LEFT MEDIALIZATION LARYNGOPLASTY;  Surgeon: Carlie Clark, MD;  Location: [facility] OR;  Service: ENT;  Laterality: Left;  RNFA   TUBAL LIGATION  1980   Social History:  reports that  she has never smoked. She has never used smokeless tobacco. She reports that she does not drink alcohol and does not use drugs.  Allergies  Allergen Reactions   Pravastatin Dermatitis and Rash    History reviewed. No pertinent family history.  Prior to Admission medications   Medication Sig Start Date End Date Taking? Authorizing Provider  ALPRAZolam  (XANAX ) 0.5 MG tablet Take 1 tablet (0.5 mg total) by mouth 2 (two) times daily as needed for anxiety. Patient taking differently: Take 0.5 mg by mouth 3 (three) times daily. 09/08/19  Yes Hawks, Christy A, FNP  Aspirin-Caffeine (BC FAST PAIN RELIEF PO) Take 1 Package by mouth 2 (two) times daily as needed (pain).   Yes [provider]  atorvastatin  (LIPITOR) 20 MG tablet Take 1 tablet (20 mg total) by mouth daily. 06/29/19  Yes Hawks, Christy A, FNP  cetirizine (ZYRTEC) 10 MG tablet Take 10 mg by mouth daily. 10/25/19  Yes [provider]  cholecalciferol  (VITAMIN D3) 25 MCG (1000 UNIT) tablet Take 1,000 Units by mouth daily.   Yes [provider]  fluticasone  (CUTIVATE ) 0.05 % cream Apply 1 Application topically daily as needed (psoriasis). 03/01/24  Yes [provider]  furosemide  (LASIX ) 20 MG tablet TAKE 1 TABLET EVERY DAY Patient taking differently: Take 20 mg by mouth daily as needed  for edema. 11/06/19  Yes Hawks, Christy A, FNP  hydrocortisone cream 1 % Apply 1 application. topically daily as needed for itching (psoriasis).   Yes [provider]  levothyroxine  (SYNTHROID ) 112 MCG tablet Take 112 mcg by mouth every morning. 07/14/21  Yes [provider]  lisinopril  (ZESTRIL ) 40 MG tablet Take 40 mg by mouth daily. 02/09/20  Yes [provider]  metoprolol  succinate (TOPROL -XL) 100 MG 24 hr tablet Take 50 mg by mouth daily.   Yes [provider]  pregabalin  (LYRICA ) 200 MG capsule Take 1 capsule (200 mg total) by mouth 2 (two) times daily. 06/29/19  Yes Hawks, Christy A, FNP   sertraline  (ZOLOFT ) 100 MG tablet Take 1 tablet (100 mg total) by mouth daily. 06/29/19  Yes Lavell Bari LABOR, FNP    Physical Exam: Vitals:   03/13/24 1300 03/13/24 1315 03/13/24 1330 03/13/24 1415  BP: (!) 148/90 136/85 (!) 155/74 (!) 143/69  Pulse: (!) 53 (!) 54 (!) 56 (!) 54  Resp: 19 20 17 15   Temp:      TempSrc:      SpO2: 92% 92% 92% 93%  Weight:      Height:      Gen: Elderly pleasant female in no distress Pulm: Clear, nonlabored. Taken off O2 and dipped into mid-80%'s with good pleth, mild associated dyspnea.   CV: Regular bradycardia, rate in low 50's, no MRG, 1+ pitting LE edema GI: Soft, NT, ND, +BS Neuro: Alert and oriented. No new focal deficits. Ext: Warm, dry, good DP pulses. RLE shortened, externally rotated.  Skin: No new rashes, lesions or ulcers on visualized skin   Data Reviewed: WBC 11.5k, hgb 14.7, plt 145 CXR: No infiltrate, enlarged cardiac silhouette CK 120 ECG: SB, 1st deg AVB.  Random glucose 130, other BMP wnl.  INR 1.3 DG pelvis: displaced and impacted right femoral neck fracture. Relative osteopenia  Assessment and Plan: Right femoral neck fracture s/p fall at home:  - Orthopedics consulted for repair, ok to admit to [facility]. Dr. Areatha evaluation appreciated. Will keep NPO p MN. Bed rest until procedure, then WB per orthopedics. Get PT/OT evaluations POD #1.  - Not completely clear circumstances surrounding fall, ECG/tele thus far shows sinus bradycardia. Will keep on telemetry. Unable to check orthostatics.   Acute hypoxic respiratory failure: No hx tobacco use, COPD, inhalers. Has a recent cough but clear CXR. No pleuritic chest pain or known DVT/PE risk factors, but hypoxemia was confirmed during my evaluation.  - CTA chest >> personally reviewed, opacity suggestive of aspiration. Pt with hypoxia, leukocytosis and AMS, likely aspirated. will start unasyn . No PE - Supplement oxygen to maintain normal WOB and SpO2 >89%.  - Incentive  spirometry  Cardiomegaly, leg swelling: Pt reports chronic leg swelling which is currently improved from baseline. Takes prn lasix , hasn't taken in a while.  - Pro-BNP >> elevated. Will give a dose of lasix , check echo.  Hypothyroidism:  - Check TSH with bradycardia - Continue synthroid   Anxiety, depression: - Sertraline   - Continue alprazolam  0.5mg  TID prn  HTN:  - Sinus bradycardia noted with 1st deg AVB. Appears to be on metoprolol , though ?if symptomatic bradycardia contributed to fall. Will not reorder this today.  - Lisinopril  40mg  home med continued  - prn hydralazine   HLD:  - Atorvastatin  20mg , note pravastatin allergy .  Neuropathy, osteoarthritis:  - Tylenol  prn mild pain, continue home lyrica  200mg  BID  Thrombocytopenia:  - Mild, will monitor in AM. Use heparin  for VTE  ppx.   DNR: POA.  - Discussed with patient and son at length at admission. No limitation on care prior to cardiorespiratory arrest (death).   Advance Care Planning: DNR  Consults: Orthopedic surgery  Family Communication: At bedside  Severity of Illness: The appropriate patient status for this patient is INPATIENT. Inpatient status is judged to be reasonable and necessary in order to provide the required intensity of service to ensure the patient's safety. The patient's presenting symptoms, physical exam findings, and initial radiographic and laboratory data in the context of their chronic comorbidities is felt to place them at high risk for further clinical deterioration. Furthermore, it is not anticipated that the patient will be medically stable for discharge from the hospital within 2 midnights of admission.   * I certify that at the point of admission it is my clinical judgment that the patient will require inpatient hospital care spanning beyond 2 midnights from the point of admission due to high intensity of service, high risk for further deterioration and high frequency of surveillance  required.*  Author: Bernardino KATHEE Come, MD 03/13/2024 2:52 PM  For on call review [email or web address].

## 2024-03-13 NOTE — ED Provider Notes (Signed)
 [facility] AT [facility] Provider Note   CSN: [id number] Arrival date & time: 03/13/24  1137     Patient presents with: Mary Marks is a 88 y.o. female.  She is brought in by EMS from home after a fall.  She said she thinks she fell asleep in her chair and fell to the floor injuring her right hip.  Sometime overnight.  Could not get up off the floor.  Family found her today.  Complaining of severe left hip pain.  No other injuries or complaints.  She was found to be hypoxic here and so placed on oxygen.  She denies being short of breath.  She does not think she hit her head.  She lives alone.  She is not on a blood thinner.   The history is provided by the patient and the EMS personnel.  Fall This is a new problem. The current episode started 6 to 12 hours ago. The problem has not changed since onset.Pertinent negatives include no chest pain, no abdominal pain, no headaches and no shortness of breath. The symptoms are aggravated by bending and twisting. Nothing relieves the symptoms. She has tried rest for the symptoms. The treatment provided no relief.       Prior to Admission medications   Medication Sig Start Date End Date Taking? Authorizing Provider  albuterol  (VENTOLIN  HFA) 108 (90 Base) MCG/ACT inhaler Inhale 2 puffs into the lungs every 4 (four) hours as needed for wheezing or shortness of breath. 03/08/22   Stuart Vernell Norris, PA-C  ALPRAZolam  (XANAX ) 0.5 MG tablet Take 1 tablet (0.5 mg total) by mouth 2 (two) times daily as needed for anxiety. Patient taking differently: Take 0.5 mg by mouth 3 (three) times daily. 09/08/19   Lavell Lye A, FNP  Aspirin-Caffeine (BC FAST PAIN RELIEF PO) Take 1 Package by mouth daily as needed (pain).    [provider]  atorvastatin  (LIPITOR) 20 MG tablet Take 1 tablet (20 mg total) by mouth daily. 06/29/19   Lavell Lye LABOR, FNP  azithromycin  (ZITHROMAX ) 250 MG tablet Take first 2 tablets  together, then 1 every day until finished. 03/08/22   Stuart Vernell Norris, PA-C  busPIRone  (BUSPAR ) 5 MG tablet TAKE 1 TABLET (5 MG TOTAL) BY MOUTH 3 (THREE) TIMES DAILY. Patient not taking: Reported on 05/14/2021 11/06/19   Lavell Lye LABOR, FNP  cetirizine (ZYRTEC) 10 MG tablet Take 10 mg by mouth daily. 10/25/19   [provider]  cholecalciferol  (VITAMIN D3) 25 MCG (1000 UNIT) tablet Take 1,000 Units by mouth daily.    [provider]  clobetasol  (TEMOVATE ) 0.05 % external solution Apply 1 application topically daily. Patient not taking: Reported on 07/29/2021 03/19/20   Livingston Rigg, MD  fluticasone  (FLONASE ) 50 MCG/ACT nasal spray Place 1 spray into both nostrils daily. 11/03/19   Lavell Lye A, FNP  furosemide  (LASIX ) 20 MG tablet TAKE 1 TABLET EVERY DAY Patient taking differently: Take 20 mg by mouth daily as needed for edema. 11/06/19   Lavell Lye A, FNP  guaiFENesin  (MUCINEX ) 600 MG 12 hr tablet Take 1 tablet (600 mg total) by mouth 2 (two) times daily as needed. 03/08/22   Stuart Vernell Norris, PA-C  hydrocortisone cream 1 % Apply 1 application. topically daily as needed for itching (psoriasis).    [provider]  levothyroxine  (SYNTHROID ) 112 MCG tablet Take 112 mcg by mouth every morning. 07/14/21   [provider]  lisinopril  (ZESTRIL ) 40 MG  tablet Take 40 mg by mouth daily. 02/09/20   [provider]  methotrexate (RHEUMATREX) 2.5 MG tablet Take 2.5 mg by mouth once a week. Caution:Chemotherapy. Protect from light.  Reports takes 4 pills weekly. Did not take dose on Saturday.    [provider]  metoprolol  succinate (TOPROL -XL) 100 MG 24 hr tablet Take 100 mg by mouth daily.    [provider]  nystatin  cream (MYCOSTATIN ) Apply to affected area 2 times daily 03/08/22   Stuart Vernell Norris, PA-C  pregabalin  (LYRICA ) 200 MG capsule Take 1 capsule (200 mg total) by mouth 2 (two) times daily. 06/29/19   Lavell Bari LABOR,  FNP  sertraline  (ZOLOFT ) 100 MG tablet Take 1 tablet (100 mg total) by mouth daily. 06/29/19   Lavell Bari LABOR, FNP    Allergies: Pravachol [pravastatin]    Review of Systems  Constitutional:  Negative for fever.  Eyes:  Negative for visual disturbance.  Respiratory:  Negative for shortness of breath.   Cardiovascular:  Negative for chest pain.  Gastrointestinal:  Negative for abdominal pain.  Genitourinary:  Negative for dysuria.  Musculoskeletal:  Negative for neck pain.  Neurological:  Negative for headaches.    Updated Vital Signs BP (!) 185/103   Pulse (!) 54   Temp 97.7 F (36.5 C) (Oral)   Ht 5' 5 (1.651 m)   Wt 79.4 kg   SpO2 93%   BMI 29.12 kg/m   Physical Exam Vitals and nursing note reviewed.  Constitutional:      General: She is not in acute distress.    Appearance: Normal appearance. She is well-developed.  HENT:     Head: Normocephalic and atraumatic.  Eyes:     Conjunctiva/sclera: Conjunctivae normal.  Cardiovascular:     Rate and Rhythm: Normal rate and regular rhythm.     Heart sounds: No murmur heard. Pulmonary:     Effort: Pulmonary effort is normal. No respiratory distress.     Breath sounds: Normal breath sounds.  Abdominal:     Palpations: Abdomen is soft.     Tenderness: There is no abdominal tenderness. There is no guarding or rebound.  Musculoskeletal:        General: Tenderness present.     Cervical back: Neck supple.     Comments: She has some diffuse tenderness around her right hip.  Her right lower leg is shortened and rotated externally.  Knee and ankle nontender.  Other extremities full range of motion without any pain or limitations.  Distal pulses motor and sensation intact  Skin:    General: Skin is warm and dry.     Capillary Refill: Capillary refill takes less than 2 seconds.  Neurological:     General: No focal deficit present.     Mental Status: She is alert.     Cranial Nerves: No cranial nerve deficit.     Sensory: No  sensory deficit.     Motor: No weakness.     (all labs ordered are listed, but only abnormal results are displayed) Labs Reviewed  BASIC METABOLIC PANEL WITH GFR - Abnormal; Notable for the following components:      Result Value   Glucose, Bld 130 (*)    All other components within normal limits  CBC WITH DIFFERENTIAL/PLATELET - Abnormal; Notable for the following components:   WBC 11.5 (*)    RBC 5.15 (*)    Platelets 145 (*)    Neutro Abs 9.3 (*)    All other components within  normal limits  PROTIME-INR - Abnormal; Notable for the following components:   Prothrombin Time 16.8 (*)    INR 1.3 (*)    All other components within normal limits  PRO BRAIN NATRIURETIC PEPTIDE - Abnormal; Notable for the following components:   Pro Brain Natriuretic Peptide 8,312.0 (*)    All other components within normal limits  SURGICAL PCR SCREEN  CK  TSH  VITAMIN D  25 HYDROXY (VIT D DEFICIENCY, FRACTURES)  CBC  PROTIME-INR  TYPE AND SCREEN    EKG: EKG Interpretation Date/Time:  Monday March 13 2024 12:04:36 EST Ventricular Rate:  65 PR Interval:  274 QRS Duration:  95 QT Interval:  447 QTC Calculation: 465 R Axis:   107  Text Interpretation: Sinus or ectopic atrial rhythm Prolonged PR interval Right axis deviation RSR' in V1 or V2, probably normal variant Nonspecific T abnormalities, anterior leads Confirmed by Towana Sharper [phone]) on 03/13/2024 12:11:37 PM  Radiology: CT Angio Chest PE W/Cm &/Or Wo Cm Result Date: 03/13/2024 CLINICAL DATA:  Status post fall.  Hypoxia. EXAM: CT ANGIOGRAPHY CHEST WITH CONTRAST TECHNIQUE: Multidetector CT imaging of the chest was performed using the standard protocol during bolus administration of intravenous contrast. Multiplanar CT image reconstructions and MIPs were obtained to evaluate the vascular anatomy. RADIATION DOSE REDUCTION: This exam was performed according to the departmental dose-optimization program which includes automated exposure  control, adjustment of the mA and/or kV according to patient size and/or use of iterative reconstruction technique. CONTRAST:  75mL OMNIPAQUE  IOHEXOL  350 MG/ML SOLN COMPARISON:  CT chest dated 06/19/2021 FINDINGS: Cardiovascular: The study is high quality for the evaluation of pulmonary embolism. There are no filling defects in the central, lobar, segmental or subsegmental pulmonary artery branches to suggest acute pulmonary embolism. Heterogeneous enhancement of a right middle lobe segmental pulmonary artery (5:56 in an area of motion artifact. Great vessels are normal in course and caliber. Dilated main pulmonary artery measures 3.6 cm. Multichamber cardiomegaly. No significant pericardial fluid/thickening. Coronary artery calcifications and aortic atherosclerosis. Reflux of contrast material into the hepatic veins, suggesting a degree of right heart dysfunction. Mediastinum/Nodes: Imaged thyroid  gland without nodules meeting criteria for imaging follow-up by size. Normal esophagus. 11 mm precarinal lymph node (5:40), previously 9 mm, and 14 mm subcarinal lymph node (5:58), previously 10 mm. Lungs/Pleura: The central airways are patent. Adherent secretions within the trachea. Irregular consolidation within the dependent right upper lobe. Diffuse mosaic attenuation. Mild diffuse bronchial wall thickening with subsegmental mucous plugging. No pneumothorax. No pleural effusion. Upper abdomen: Normal. Musculoskeletal: No acute or abnormal lytic or blastic osseous lesions. Partially imaged inferior endplate compression of T12. Review of the MIP images confirms the above findings. IMPRESSION: 1. No evidence of acute pulmonary embolism. Heterogeneous enhancement of a right middle lobe segmental pulmonary artery in an area of motion artifact is favored to be artifactual. 2. Irregular consolidation within the dependent right upper lobe, suspicious for aspiration. 3. Diffuse mosaic attenuation, which can be seen in the  setting of small airways disease. 4. Mildly enlarged mediastinal lymph nodes, likely reactive. 5. Dilated main pulmonary artery, which can be seen in the setting of pulmonary arterial hypertension. 6. Multichamber cardiomegaly with reflux of contrast material into the hepatic veins, suggesting a degree of right heart dysfunction. 7.  Aortic Atherosclerosis (ICD10-I70.0). Electronically Signed   By: Limin  Xu M.D.   On: 03/13/2024 15:25   CT Head Wo Contrast Result Date: 03/13/2024 EXAM: CT HEAD WITHOUT CONTRAST 03/13/2024 02:45:58 PM TECHNIQUE: CT of the head  was performed without the administration of intravenous contrast. Automated exposure control, iterative reconstruction, and/or weight based adjustment of the mA/kV was utilized to reduce the radiation dose to as low as reasonably achievable. COMPARISON: None available. CLINICAL HISTORY: Head trauma, minor (Age >= 65y). FINDINGS: BRAIN AND VENTRICLES: No acute hemorrhage. No evidence of acute infarct. No hydrocephalus. No extra-axial collection. No mass effect or midline shift. Age-related cerebral atrophy. Periventricular white matter changes, likely sequela of chronic small vessel ischemic disease. Remote left cerebellar infarct. Remote lacunar infarcts in the bilateral basal ganglia. Atherosclerotic calcifications in intracranial carotid and vertebral arteries. ORBITS: Bilateral lens replacements. SINUSES: Near complete opacification of right maxillary sinus with osseous thickening, likely chronic sinusitis. Mucosal thickening in ethmoid air cells. SOFT TISSUES AND SKULL: No acute soft tissue abnormality. No skull fracture. IMPRESSION: 1. No acute intracranial abnormality. 2. Age-related cerebral atrophy, chronic small vessel ischemic disease, and remote left cerebellar infarct. 3. Near complete opacification of the right maxillary sinus with osseous thickening, likely chronic sinusitis, with additional mucosal thickening in the ethmoid air cells.  Electronically signed by: Donnice Mania MD 03/13/2024 03:08 PM EST RP Workstation: [id number]   DG Chest 1 View Result Date: 03/13/2024 CLINICAL DATA:  Right hip fracture.  Pre-op clearance exam EXAM: CHEST  1 VIEW COMPARISON:  03/08/2022 FINDINGS: Moderate cardiomegaly again noted as well as ectasia of the thoracic aorta. Both lungs are clear. Several old right rib fracture deformities are noted. IMPRESSION: Moderate cardiomegaly. No active lung disease. Electronically Signed   By: Norleen DELENA Kil M.D.   On: 03/13/2024 13:15   DG Hip Unilat With Pelvis 2-3 Views Right Result Date: 03/13/2024 CLINICAL DATA:  Fall.  Right hip pain. EXAM: DG HIP (WITH OR WITHOUT PELVIS) 3V RIGHT COMPARISON:  None Available. FINDINGS: Displaced and impacted right femoral neck fracture is seen. No dislocation. Osteopenia noted. Degenerative changes are seen involving the pubic symphysis and lower lumbar spine. IMPRESSION: Displaced and impacted right femoral neck fracture. Electronically Signed   By: Norleen DELENA Kil M.D.   On: 03/13/2024 13:14     .Critical Care  Performed by: Towana Ozell BROCKS, MD Authorized by: Towana Ozell BROCKS, MD   Critical care provider statement:    Critical care time (minutes):  45   Critical care time was exclusive of:  Separately billable procedures and treating other patients   Critical care was necessary to treat or prevent imminent or life-threatening deterioration of the following conditions:  Respiratory failure and trauma   Critical care was time spent personally by me on the following activities:  Development of treatment plan with patient or surrogate, discussions with consultants, evaluation of patient's response to treatment, examination of patient, obtaining history from patient or surrogate, ordering and performing treatments and interventions, ordering and review of laboratory studies, ordering and review of radiographic studies, pulse oximetry, re-evaluation of patient's condition and  review of old charts   I assumed direction of critical care for this patient from another provider in my specialty: no      Medications Ordered in the ED  atorvastatin  (LIPITOR) tablet 20 mg (has no administration in time range)  ALPRAZolam  (XANAX ) tablet 0.5 mg (has no administration in time range)  sertraline  (ZOLOFT ) tablet 100 mg (has no administration in time range)  levothyroxine  (SYNTHROID ) tablet 112 mcg (has no administration in time range)  pregabalin  (LYRICA ) capsule 200 mg (has no administration in time range)  cholecalciferol  (VITAMIN D3) 25 MCG (1000 UNIT) tablet 1,000 Units (1,000 Units Oral Given 03/13/24 1601)  heparin  injection 5,000 Units (has no administration in time range)  sodium chloride  flush (NS) 0.9 % injection 3 mL (has no administration in time range)  acetaminophen  (TYLENOL ) tablet 650 mg (has no administration in time range)    Or  acetaminophen  (TYLENOL ) suppository 650 mg (has no administration in time range)  morphine  (PF) 2 MG/ML injection 2-4 mg (has no administration in time range)  ondansetron  (ZOFRAN ) tablet 4 mg (has no administration in time range)    Or  ondansetron  (ZOFRAN ) injection 4 mg (has no administration in time range)  lisinopril  (ZESTRIL ) tablet 40 mg (has no administration in time range)  hydrALAZINE  (APRESOLINE ) injection 10 mg (has no administration in time range)  Oral care mouth rinse (has no administration in time range)  Ampicillin -Sulbactam (UNASYN ) 3 g in sodium chloride  0.9 % 100 mL IVPB (has no administration in time range)  iohexol  (OMNIPAQUE ) 350 MG/ML injection 75 mL (75 mLs Intravenous Contrast Given 03/13/24 1446)    Clinical Course as of 03/13/24 1729  Mon Mar 13, 2024  1306 Chest x-ray showing cardiomegaly no clear infiltrate.  Hip x-ray showing likely femoral neck fracture.  Discussed with orthopedics Dr. Onesimo.  [MB]  1341 Orthopedics recommending medical admission, may eat today, n.p.o. after midnight and Dr.  Margrette will likely operate tomorrow. [MB]  1357 Discussed with [facility] hospitalist Dr. Bryn.  With her hypoxia of unclear etiology he would like us  to get a CAT scan of her chest to make sure she does not have PE.  With her trauma and possible need for anticoagulation will also put her in for head CT. [MB]    Clinical Course User Index [MB] Towana Ozell BROCKS, MD                                 Medical Decision Making Amount and/or Complexity of Data Reviewed Labs: ordered. Radiology: ordered.  Risk OTC drugs. Prescription drug management. Decision regarding hospitalization.   This patient complains of fall, right hip pain, hypoxia; this involves an extensive number of treatment Options and is a complaint that carries with it a high risk of complications and morbidity. The differential includes pneumonia, pneumothorax, PE, fracture, dislocation  I ordered, reviewed and interpreted labs, which included CBC with elevated white count, chemistries with elevated glucose, CK normal I ordered medication oxygen pain medication and reviewed PMP when indicated. I ordered imaging studies which included chest and right hip x-ray, CT head CT angio chest and I independently    visualized and interpreted imaging which showed acute right hip fracture.  No PE.  Possible aspiration pneumonia Additional history obtained from EMS Previous records obtained and reviewed in epic including outpatient PCP and Ortho notes I consulted orthopedics Dr. Onesimo and [facility] hospitalist Dr. Bryn and discussed lab and imaging findings and discussed disposition.  Patient declined me reaching out to her orthopedic team in [place].  She said she would rather stay in [place] Cardiac monitoring reviewed, sinus rhythm Social determinants considered, no significant barriers Critical Interventions: Initiation of oxygen and workup of patient's hypoxia  After the interventions stated above, I reevaluated the patient and  found patient to be requiring 4 L of nasal cannula, pain improved. Admission and further testing considered, she would benefit from mission the hospital for continued workup of her hypoxia, evaluation by orthopedics for likely operative repair.  Patient in agreement with plan for admission.      Final diagnoses:  Acute respiratory failure with hypoxia (HCC)  Fall, initial encounter  Closed fracture of right hip, initial encounter [facility])    ED Discharge Orders     None          Towana Ozell BROCKS, MD 03/13/24 1733

## 2024-03-14 ENCOUNTER — Encounter (HOSPITAL_COMMUNITY)
Admission: EM | Disposition: A | Discharge status: Skilled Nursing Facility | Payer: Self-pay | Source: Home / Self Care | Attending: Hospitalist

## 2024-03-14 ENCOUNTER — Inpatient Hospital Stay (HOSPITAL_COMMUNITY)

## 2024-03-14 ENCOUNTER — Other Ambulatory Visit (HOSPITAL_COMMUNITY): Payer: Self-pay | Admitting: *Deleted

## 2024-03-14 ENCOUNTER — Encounter (HOSPITAL_COMMUNITY): Payer: Self-pay | Admitting: Family Medicine

## 2024-03-14 ENCOUNTER — Inpatient Hospital Stay (HOSPITAL_COMMUNITY): Admitting: Anesthesiology

## 2024-03-14 HISTORY — PX: HIP ARTHROPLASTY: SHX981

## 2024-03-14 LAB — CBC
HCT: 41.3 % (ref 36.0–46.0)
Hemoglobin: 13.3 g/dL (ref 12.0–15.0)
MCH: 28.4 pg (ref 26.0–34.0)
MCHC: 32.2 g/dL (ref 30.0–36.0)
MCV: 88.2 fL (ref 80.0–100.0)
Platelets: 141 K/uL — ABNORMAL LOW (ref 150–400)
RBC: 4.68 MIL/uL (ref 3.87–5.11)
RDW: 14.6 % (ref 11.5–15.5)
WBC: 11.9 K/uL — ABNORMAL HIGH (ref 4.0–10.5)
nRBC: 0 % (ref 0.0–0.2)

## 2024-03-14 LAB — PROTIME-INR
INR: 1.2 (ref 0.8–1.2)
Prothrombin Time: 16.3 s — ABNORMAL HIGH (ref 11.4–15.2)

## 2024-03-14 LAB — BASIC METABOLIC PANEL WITH GFR
Anion gap: 8 (ref 5–15)
BUN: 17 mg/dL (ref 8–23)
CO2: 28 mmol/L (ref 22–32)
Calcium: 8.3 mg/dL — ABNORMAL LOW (ref 8.9–10.3)
Chloride: 105 mmol/L (ref 98–111)
Creatinine, Ser: 0.63 mg/dL (ref 0.44–1.00)
GFR, Estimated: >60 mL/min (ref >60)
Glucose, Bld: 112 mg/dL — ABNORMAL HIGH (ref 70–99)
Potassium: 3.5 mmol/L (ref 3.5–5.1)
Sodium: 141 mmol/L (ref 135–145)

## 2024-03-14 SURGERY — HEMIARTHROPLASTY (BIPOLAR) HIP, POSTERIOR APPROACH FOR FRACTURE
Anesthesia: Spinal | Site: Hip | Laterality: Right

## 2024-03-14 MED ORDER — ALUM & MAG HYDROXIDE-SIMETH 200-200-20 MG/5ML PO SUSP: 30.0000 mL | ORAL | Status: DC | PRN

## 2024-03-14 MED ORDER — SODIUM CHLORIDE 0.9 % IR SOLN
Status: DC | PRN
  Administered 2024-03-14: 3000 mL

## 2024-03-14 MED ORDER — FENTANYL CITRATE (PF) 100 MCG/2ML IJ SOLN
INTRAMUSCULAR | Status: AC
  Filled 2024-03-14: qty 2

## 2024-03-14 MED ORDER — METOCLOPRAMIDE HCL 10 MG PO TABS: 5.0000 mg | ORAL_TABLET | Freq: Three times a day (TID) | ORAL | Status: DC | PRN

## 2024-03-14 MED ORDER — PROPOFOL 500 MG/50ML IV EMUL
INTRAVENOUS | Status: DC | PRN
  Administered 2024-03-14: 50 ug/kg/min via INTRAVENOUS

## 2024-03-14 MED ORDER — CHLORHEXIDINE GLUCONATE 0.12 % MT SOLN
OROMUCOSAL | Status: AC
  Filled 2024-03-14: qty 15

## 2024-03-14 MED ORDER — BUPIVACAINE-EPINEPHRINE (PF) 0.5% -1:200000 IJ SOLN
INTRAMUSCULAR | Status: AC
  Filled 2024-03-14: qty 30

## 2024-03-14 MED ORDER — FENTANYL CITRATE (PF) 100 MCG/2ML IJ SOLN
INTRAMUSCULAR | Status: DC | PRN
  Administered 2024-03-14 (×2): 50 ug via INTRAVENOUS

## 2024-03-14 MED ORDER — ONDANSETRON HCL 4 MG/2ML IJ SOLN
4.0000 mg | Freq: Once | INTRAMUSCULAR | Status: AC | PRN
  Administered 2024-03-14: 4 mg via INTRAVENOUS

## 2024-03-14 MED ORDER — PANTOPRAZOLE SODIUM 40 MG PO TBEC
40.0000 mg | DELAYED_RELEASE_TABLET | Freq: Every day | ORAL | Status: DC
  Administered 2024-03-14 – 2024-03-21 (×7): 40 mg via ORAL
  Filled 2024-03-14 (×8): qty 1

## 2024-03-14 MED ORDER — CHLORHEXIDINE GLUCONATE 0.12 % MT SOLN
15.0000 mL | Freq: Once | OROMUCOSAL | Status: AC
  Administered 2024-03-14: 15 mL via OROMUCOSAL

## 2024-03-14 MED ORDER — OXYCODONE HCL 5 MG PO TABS: 5.0000 mg | ORAL_TABLET | Freq: Once | ORAL | Status: DC | PRN

## 2024-03-14 MED ORDER — METHOCARBAMOL 1000 MG/10ML IJ SOLN: 500.0000 mg | Freq: Four times a day (QID) | INTRAMUSCULAR | Status: DC | PRN

## 2024-03-14 MED ORDER — POLYETHYLENE GLYCOL 3350 17 G PO PACK: 17.0000 g | PACK | Freq: Every day | ORAL | Status: DC | PRN

## 2024-03-14 MED ORDER — EPHEDRINE SULFATE (PRESSORS) 25 MG/5ML IV SOSY
PREFILLED_SYRINGE | INTRAVENOUS | Status: DC | PRN
  Administered 2024-03-14 (×3): 5 mg via INTRAVENOUS
  Administered 2024-03-14: 10 mg via INTRAVENOUS
  Administered 2024-03-14 (×2): 5 mg via INTRAVENOUS
  Administered 2024-03-14: 10 mg via INTRAVENOUS

## 2024-03-14 MED ORDER — SODIUM CHLORIDE 0.9 % IV SOLN: INTRAVENOUS | Status: DC

## 2024-03-14 MED ORDER — ACETAMINOPHEN 325 MG PO TABS
325.0000 mg | ORAL_TABLET | Freq: Four times a day (QID) | ORAL | Status: DC | PRN
  Administered 2024-03-18 – 2024-03-19 (×2): 650 mg via ORAL
  Filled 2024-03-14 (×2): qty 2

## 2024-03-14 MED ORDER — DOCUSATE SODIUM 100 MG PO CAPS
100.0000 mg | ORAL_CAPSULE | Freq: Two times a day (BID) | ORAL | Status: DC
  Administered 2024-03-14 – 2024-03-21 (×14): 100 mg via ORAL
  Filled 2024-03-14 (×15): qty 1

## 2024-03-14 MED ORDER — TRANEXAMIC ACID-NACL 1000-0.7 MG/100ML-% IV SOLN
1000.0000 mg | Freq: Once | INTRAVENOUS | Status: AC
  Administered 2024-03-14: 1000 mg via INTRAVENOUS
  Filled 2024-03-14: qty 100

## 2024-03-14 MED ORDER — DEXAMETHASONE SOD PHOSPHATE PF 10 MG/ML IJ SOLN
INTRAMUSCULAR | Status: DC | PRN
  Administered 2024-03-14: 5 mg via INTRAVENOUS

## 2024-03-14 MED ORDER — MORPHINE SULFATE (PF) 2 MG/ML IV SOLN
0.5000 mg | INTRAVENOUS | Status: DC | PRN
  Administered 2024-03-14 (×2): 1 mg via INTRAVENOUS
  Filled 2024-03-14 (×2): qty 1

## 2024-03-14 MED ORDER — LACTATED RINGERS IV SOLN: INTRAVENOUS | Status: DC

## 2024-03-14 MED ORDER — ENOXAPARIN SODIUM 40 MG/0.4ML IJ SOSY
40.0000 mg | PREFILLED_SYRINGE | INTRAMUSCULAR | Status: DC
  Administered 2024-03-15 – 2024-03-21 (×7): 40 mg via SUBCUTANEOUS
  Filled 2024-03-14 (×7): qty 0.4

## 2024-03-14 MED ORDER — METOCLOPRAMIDE HCL 5 MG/ML IJ SOLN: 5.0000 mg | Freq: Three times a day (TID) | INTRAMUSCULAR | Status: DC | PRN

## 2024-03-14 MED ORDER — TRAMADOL HCL 50 MG PO TABS
50.0000 mg | ORAL_TABLET | Freq: Four times a day (QID) | ORAL | Status: DC
  Administered 2024-03-15 – 2024-03-19 (×14): 50 mg via ORAL
  Filled 2024-03-14 (×15): qty 1

## 2024-03-14 MED ORDER — BISACODYL 10 MG RE SUPP: 10.0000 mg | Freq: Every day | RECTAL | Status: DC | PRN

## 2024-03-14 MED ORDER — FENTANYL CITRATE (PF) 50 MCG/ML IJ SOSY: 25.0000 ug | PREFILLED_SYRINGE | INTRAMUSCULAR | Status: DC | PRN

## 2024-03-14 MED ORDER — BUPIVACAINE-EPINEPHRINE (PF) 0.5% -1:200000 IJ SOLN
INTRAMUSCULAR | Status: DC | PRN
  Administered 2024-03-14: 30 mL via PERINEURAL

## 2024-03-14 MED ORDER — METHOCARBAMOL 500 MG PO TABS: 500.0000 mg | ORAL_TABLET | Freq: Four times a day (QID) | ORAL | Status: DC | PRN

## 2024-03-14 MED ORDER — HYDROCODONE-ACETAMINOPHEN 7.5-325 MG PO TABS
1.0000 | ORAL_TABLET | ORAL | Status: DC | PRN
  Administered 2024-03-15: 1 via ORAL
  Filled 2024-03-14: qty 1

## 2024-03-14 MED ORDER — 0.9 % SODIUM CHLORIDE (POUR BTL) OPTIME
TOPICAL | Status: DC | PRN
  Administered 2024-03-14: 1000 mL

## 2024-03-14 MED ORDER — OXYCODONE HCL 5 MG/5ML PO SOLN: 5.0000 mg | Freq: Once | ORAL | Status: DC | PRN

## 2024-03-14 MED ORDER — ORAL CARE MOUTH RINSE: 15.0000 mL | Freq: Once | OROMUCOSAL | Status: AC

## 2024-03-14 MED ORDER — HYDROCODONE-ACETAMINOPHEN 5-325 MG PO TABS
1.0000 | ORAL_TABLET | ORAL | Status: DC | PRN
  Administered 2024-03-14 (×2): 2 via ORAL
  Administered 2024-03-15 – 2024-03-16 (×2): 1 via ORAL
  Filled 2024-03-14: qty 2
  Filled 2024-03-14 (×2): qty 1
  Filled 2024-03-14: qty 2

## 2024-03-14 SURGICAL SUPPLY — 49 items
BIT DRILL 2.8X128 (BIT) ×1 IMPLANT
BLADE SAGITTAL 25.0X1.27X90 (BLADE) ×1 IMPLANT
CHLORAPREP W/TINT 26 (MISCELLANEOUS) ×1 IMPLANT
CLOTH BEACON ORANGE TIMEOUT ST (SAFETY) ×1 IMPLANT
COUNTER NDL MAGNETIC 40 RED (SET/KITS/TRAYS/PACK) ×1 IMPLANT
COUNTER NEEDLE MAGNETIC 40 RED (SET/KITS/TRAYS/PACK) ×1 IMPLANT
COVER LIGHT HANDLE STERIS (MISCELLANEOUS) ×2 IMPLANT
DRAPE HIP W/POCKET STRL (MISCELLANEOUS) ×1 IMPLANT
DRAPE U-SHAPE 47X51 STRL (DRAPES) ×1 IMPLANT
DRESSING AQUACEL AG ADV 3.5X12 (MISCELLANEOUS) ×1 IMPLANT
DRSG MEPILEX SACRM 8.7X9.8 (GAUZE/BANDAGES/DRESSINGS) ×1 IMPLANT
DRSG TEGADERM 4X4.75 (GAUZE/BANDAGES/DRESSINGS) IMPLANT
ELECTRODE REM PT RTRN 9FT ADLT (ELECTROSURGICAL) ×1 IMPLANT
GAUZE SPONGE 4X4 12PLY STRL (GAUZE/BANDAGES/DRESSINGS) IMPLANT
GLOVE BIO SURGEON STRL SZ7 (GLOVE) IMPLANT
GLOVE BIOGEL PI IND STRL 7.0 (GLOVE) ×2 IMPLANT
GLOVE BIOGEL PI IND STRL 8.5 (GLOVE) ×1 IMPLANT
GLOVE ECLIPSE 6.5 STRL STRAW (GLOVE) IMPLANT
GLOVE ECLIPSE 7.0 STRL STRAW (GLOVE) IMPLANT
GLOVE SKINSENSE STRL SZ8.0 LF (GLOVE) ×2 IMPLANT
GOWN STRL REUS W/TWL LRG LVL3 (GOWN DISPOSABLE) ×2 IMPLANT
GOWN STRL REUS W/TWL XL LVL3 (GOWN DISPOSABLE) ×1 IMPLANT
HEAD BIPOLAR DEPUY 48 (Hips) IMPLANT
HEAD FEM STD 28X+8.5 STRL (Hips) IMPLANT
INST SET MAJOR BONE (KITS) ×1 IMPLANT
KIT TURNOVER KIT A (KITS) ×1 IMPLANT
MANIFOLD NEPTUNE II (INSTRUMENTS) ×1 IMPLANT
MARKER SKIN DUAL TIP RULER LAB (MISCELLANEOUS) ×1 IMPLANT
NDL HYPO 21X1.5 SAFETY (NEEDLE) ×1 IMPLANT
NEEDLE HYPO 21X1.5 SAFETY (NEEDLE) ×1 IMPLANT
PACK TOTAL JOINT (CUSTOM PROCEDURE TRAY) ×1 IMPLANT
PAD ARMBOARD POSITIONER FOAM (MISCELLANEOUS) ×1 IMPLANT
PENCIL SMOKE EVACUATOR (MISCELLANEOUS) IMPLANT
POSITIONER HEAD 8X9X4 ADT (SOFTGOODS) ×1 IMPLANT
SET BASIN LINEN APH (SET/KITS/TRAYS/PACK) ×1 IMPLANT
SET HNDPC FAN SPRY TIP SCT (DISPOSABLE) ×1 IMPLANT
SOLN 0.9% NACL POUR BTL 1000ML (IV SOLUTION) ×1 IMPLANT
STAPLER VISISTAT (STAPLE) ×1 IMPLANT
STEM FEMORAL SZ 6MM STD ACTIS (Stem) IMPLANT
SUT BRALON NAB BRD #1 30IN (SUTURE) ×2 IMPLANT
SUT ETHIBOND 5 LR DA (SUTURE) ×2 IMPLANT
SUT MNCRL 0 VIOLET CTX 36 (SUTURE) ×1 IMPLANT
SUT MON AB 0 CT1 (SUTURE) ×1 IMPLANT
SUT VIC AB 1 CT1 27XBRD ANTBC (SUTURE) ×4 IMPLANT
SYR 30ML LL (SYRINGE) ×1 IMPLANT
SYR BULB IRRIG 60ML STRL (SYRINGE) ×1 IMPLANT
TRAY FOLEY MTR SLVR 16FR STAT (SET/KITS/TRAYS/PACK) ×1 IMPLANT
TUBE CONNECTING 12X1/4 (SUCTIONS) IMPLANT
YANKAUER SUCT 12FT TUBE ARGYLE (SUCTIONS) ×1 IMPLANT

## 2024-03-14 NOTE — Anesthesia Preprocedure Evaluation (Signed)
 Anesthesia Evaluation  Patient identified by MRN, date of birth, ID band Patient awake    Reviewed: Allergy  & Precautions, H&P , NPO status , Patient's Chart, lab work & pertinent test results, reviewed documented beta blocker date and time   Airway Mallampati: II  TM Distance: >3 FB Neck ROM: full    Dental no notable dental hx.    Pulmonary neg pulmonary ROS, shortness of breath   Pulmonary exam normal breath sounds clear to auscultation       Cardiovascular Exercise Tolerance: Good hypertension, negative cardio ROS  Rhythm:regular Rate:Normal     Neuro/Psych  PSYCHIATRIC DISORDERS Anxiety Depression    negative neurological ROS  negative psych ROS   GI/Hepatic negative GI ROS, Neg liver ROS,,,  Endo/Other  negative endocrine ROSHypothyroidism    Renal/GU negative Renal ROS  negative genitourinary   Musculoskeletal   Abdominal   Peds  Hematology negative hematology ROS (+)   Anesthesia Other Findings Per anticoagulation guidelines, it will have been 6 hours since last SQ Heparin  dose, making spinal anesthesia acceptable.  Reproductive/Obstetrics negative OB ROS                              Anesthesia Physical Anesthesia Plan  ASA: 3  Anesthesia Plan: Spinal   Post-op Pain Management:    Induction:   PONV Risk Score and Plan: Propofol  infusion  Airway Management Planned:   Additional Equipment:   Intra-op Plan:   Post-operative Plan:   Informed Consent: I have reviewed the patients History and Physical, chart, labs and discussed the procedure including the risks, benefits and alternatives for the proposed anesthesia with the patient or authorized representative who has indicated his/her understanding and acceptance.     Dental Advisory Given  Plan Discussed with: CRNA  Anesthesia Plan Comments:         Anesthesia Quick Evaluation

## 2024-03-14 NOTE — Progress Notes (Signed)
  Transition of Care [facility]) Screening Note   Patient Details  Name: Mary Marks Date of Birth: 03-09-1936   Transition of Care [facility]) CM/SW Contact:    Hoy DELENA Bigness, LCSW Phone Number: 03/14/2024, 9:57 AM    Transition of Care Department [facility]) has reviewed patient and no TOC needs have been identified at this time. We will continue to monitor patient advancement through interdisciplinary progression rounds. If new patient transition needs arise, please place a TOC consult.     03/14/24 0957  TOC Brief Assessment  Insurance and Status Reviewed  Patient has primary care physician Yes  Home environment has been reviewed From home alone  Prior level of function: Independent  Prior/Current Home Services No current home services  Social Drivers of Health Review SDOH reviewed no interventions necessary  Readmission risk has been reviewed Yes  Transition of care needs transition of care needs identified, TOC will continue to follow

## 2024-03-14 NOTE — Progress Notes (Signed)
Off the floor for surgery

## 2024-03-14 NOTE — Plan of Care (Signed)

## 2024-03-14 NOTE — Progress Notes (Signed)
 [facility] PROGRESS NOTE  Mary Marks (DOB: [birth year]) FMW:[id number] PCP: Mary Marks Mary MADISON, FNP  Brief Narrative: Mary Marks is a 88 y.o. female with a history of OA, HTN, HLD, hypothyroidism who presented to the ED on 03/13/2024 after a fall at home, found to have a displaced right femoral neck fracture. Also hypoxemic with workup suggesting aspiration pneumonia, cardiomegaly, dilated pulmonary artery by CTA. Echocardiogram pending.   Subjective: Pain controlled with no movement, got some sleep. Denies current or recent chest pain or dyspnea. Denies orthopnea.   Objective: BP 124/68   Pulse 62   Temp 97.6 F (36.4 C)   Resp 18   Ht 5' 5 (1.651 m)   Wt 79.4 kg   SpO2 93%   BMI 29.12 kg/m   Gen: Elderly female in no distress Pulm: Clear, nonlabored  CV: Regular bradycardia, no MRG, trace edema GI: Soft, NT, ND, +BS Neuro: Alert and oriented. No new focal deficits. Ext: Warm, dry, palpable RLE pulse, SILT, warm, shortened and externally rotated RLE at the hip Skin: No new rashes, lesions or ulcers on visualized skin   Assessment & Plan: Right femoral neck fracture s/p fall at home:  - Orthopedics consulted for repair > to OR 12/9 per Dr. Margrette. - Weight bearing recommendations per orthopedics.  - VTE ppx per orthopedics.  - Pain control recommendations per orthopedics - Get PT/OT evaluations POD #1.  - Not completely clear circumstances surrounding fall, ECG/tele thus far shows sinus bradycardia. Will keep on telemetry. Unable to check orthostatics.    Acute hypoxic respiratory failure: No hx tobacco use, COPD, inhalers. Has a recent cough but clear CXR. No pleuritic chest pain or known DVT/PE risk factors, but hypoxemia was confirmed during my evaluation.  - CTA chest >> personally reviewed, opacity suggestive of aspiration. Pt with hypoxia, leukocytosis and AMS, likely aspirated. will start unasyn . No PE. Also possibly small airways disease, but no wheezing.  -  Dilated pulmonary artery and contrast reflux, with cardiomegaly, we're checking echo as below.  - Supplement oxygen to maintain normal WOB and SpO2 >89%.  - Incentive spirometry   Cardiomegaly, leg swelling: Pt reports chronic leg swelling which is currently improved from baseline. Takes prn lasix , hasn't taken in a while.  - Pro-BNP >> elevated. Gave dose of lasix  - Echo pending.   Hypothyroidism: TSH 3.600.   - Continue synthroid  112mcg   Anxiety, depression: - Sertraline   - Continue alprazolam  0.5mg  TID prn   HTN:  - Sinus bradycardia noted with 1st deg AVB. Appears to be on metoprolol , though ?if symptomatic bradycardia contributed to fall. Will not reorder this today.  - Lisinopril  40mg  home med continued  - prn hydralazine    HLD:  - Atorvastatin  20mg , note pravastatin allergy .   Neuropathy, osteoarthritis:  - Tylenol  prn mild pain, continue home lyrica  200mg  BID   Thrombocytopenia:  - Mild, stable. Pending ortho recommendations, will continue heparin  for VTE ppx.    DNR: POA.  - Discussed with patient and son at length at admission. No limitation on care prior to cardiorespiratory arrest (death).   Mary Mary Marks Come, MD [facility] [email or web address] 03/14/2024, 1:48 PM

## 2024-03-14 NOTE — Transfer of Care (Signed)
 Immediate Anesthesia Transfer of Care Note  Patient: Mary Marks  Procedure(s) Performed: HEMIARTHROPLASTY (BIPOLAR) HIP, LATERAL APPROACH FOR FRACTURE (Right: Hip)  Patient Location: PACU  Anesthesia Type:MAC combined with regional for post-op pain  Level of Consciousness: awake and alert   Airway & Oxygen Therapy: Patient Spontanous Breathing and Patient connected to nasal cannula oxygen  Post-op Assessment: Report given to RN and Post -op Vital signs reviewed and stable  Post vital signs: Reviewed and stable  Last Vitals:  Vitals Value Taken Time  BP 126/75 03/14/24 13:09  Temp    Pulse 85 03/14/24 13:09  Resp 17 03/14/24 13:09  SpO2 93% 03/14/24 13:09  Vitals shown include unfiled device data.  Last Pain:  Vitals:   03/14/24 0938  TempSrc: Oral  PainSc: 0-No pain      Patients Stated Pain Goal: 6 (03/14/24 9061)  Complications: No notable events documented.

## 2024-03-14 NOTE — Consult Note (Signed)
 ORTHOPAEDIC CONSULTATION  REQUESTING PHYSICIAN: [name], MD  ASSESSMENT AND PLAN: 88 y.o. female with the following: Right femoral neck fracture  This patient requires inpatient admission to manage this problem appropriately. Orthopedics recommends admission to a medical service and we will provide consultation and follow along  The procedure has been fully reviewed with the patient; The risks and benefits of surgery have been discussed and explained and understood. Alternative treatment has also been reviewed, questions were encouraged and answered. The postoperative plan is also been reviewed. The procedure was discussed with the patient the patient's son and the patient's daughter who was on the telephone  The best course of action is to proceed with a bipolar hip replacement based on patient's age and preinjury functional level  Specific complications associated with bipolar hip replacement in the hip fracture setting include but are not limited to bleeding infection blood clot leg length discrepancy persistent limp thigh pain there is a decreased risk of dislocation compared to total hip   Chief Complaint: Pain right hip  HPI: Mary Marks is a 88 y.o. female with right femoral neck fracture.  Admitted on 03/13/2024.  Dr.Cairns is on-call for orthopedics but asked me to take the patient to expedite her surgical repair of the right hip.  The patient simply slipped out of a chair landing on her right side fracturing her right hip.  She complained of severe right hip pain which was nonradiating and it did prevent her from ambulating or standing on the right side.  She was brought to the hospital for evaluation  She was found to have a right femoral neck fracture and it is require repair  Past Medical History:  Diagnosis Date   Anxiety    Arthritis    in knees   Basal cell carcinoma 07/26/2019   nodular on right lower back - CX3+5FU   Cancer (HCC)    Melanoma on back    Depression    Dyspnea    Hyperlipidemia    Hypertension    Hypothyroidism    Melanoma (HCC)    left post shoulder   Squamous cell carcinoma of skin 03/20/1999   well diff-lower bulb nose  (txpb)   Thyroid  disease    Hypothyroid   Past Surgical History:  Procedure Laterality Date   DILATION AND CURETTAGE OF UTERUS     EXCISION OF BACK LESION     Melanoma   LARYNGOPLASTY Left 08/08/2021   Procedure: LEFT MEDIALIZATION LARYNGOPLASTY;  Surgeon: Carlie Clark, MD;  Location[facility] OR;  Service: ENT;  Laterality: Left;  RNFA   TUBAL LIGATION  1980   Social History   Socioeconomic History   Marital status: Widowed    Spouse name: Not on file   Number of children: 2   Years of education: Not on file   Highest education level: Not on file  Occupational History   Not on file  Tobacco Use   Smoking status: Never   Smokeless tobacco: Never  Vaping Use   Vaping status: Never Used  Substance and Sexual Activity   Alcohol use: Never   Drug use: Never   Sexual activity: Not on file  Other Topics Concern   Not on file  Social History Narrative   Not on file   Social Drivers of Health   Financial Resource Strain: Not on file  Food Insecurity: No Food Insecurity (03/13/2024)   Hunger Vital Sign    Worried About Running Out of Food  in the Last Year: Never true    Ran Out of Food in the Last Year: Never true  Transportation Needs: No Transportation Needs (03/13/2024)   PRAPARE - Administrator, Civil Service (Medical): No    Lack of Transportation (Non-Medical): No  Physical Activity: Not on file  Stress: Not on file  Social Connections: Moderately Isolated (03/13/2024)   Social Connection and Isolation Panel    Frequency of Communication with Friends and Family: More than three times a week    Frequency of Social Gatherings with Friends and Family: Three times a week    Attends Religious Services: More than 4 times per year    Active Member of Clubs or Organizations: No     Attends Banker Meetings: Never    Marital Status: Widowed   History reviewed. No pertinent family history. Allergies  Allergen Reactions   Pravastatin Dermatitis and Rash   Prior to Admission medications   Medication Sig Start Date End Date Taking? Authorizing Provider  ALPRAZolam  (XANAX ) 0.5 MG tablet Take 1 tablet (0.5 mg total) by mouth 2 (two) times daily as needed for anxiety. Patient taking differently: Take 0.5 mg by mouth 3 (three) times daily. 09/08/19  Yes Hawks, Christy A, FNP  Aspirin-Caffeine (BC FAST PAIN RELIEF PO) Take 1 Package by mouth 2 (two) times daily as needed (pain).   Yes [provider]  atorvastatin  (LIPITOR) 20 MG tablet Take 1 tablet (20 mg total) by mouth daily. 06/29/19  Yes Hawks, Christy A, FNP  cetirizine (ZYRTEC) 10 MG tablet Take 10 mg by mouth daily. 10/25/19  Yes [provider]  cholecalciferol  (VITAMIN D3) 25 MCG (1000 UNIT) tablet Take 1,000 Units by mouth daily.   Yes [provider]  fluticasone  (CUTIVATE ) 0.05 % cream Apply 1 Application topically daily as needed (psoriasis). 03/01/24  Yes [provider]  furosemide  (LASIX ) 20 MG tablet TAKE 1 TABLET EVERY DAY Patient taking differently: Take 20 mg by mouth daily as needed for edema. 11/06/19  Yes Hawks, Christy A, FNP  hydrocortisone cream 1 % Apply 1 application. topically daily as needed for itching (psoriasis).   Yes [provider]  levothyroxine  (SYNTHROID ) 112 MCG tablet Take 112 mcg by mouth every morning. 07/14/21  Yes [provider]  lisinopril  (ZESTRIL ) 40 MG tablet Take 40 mg by mouth daily. 02/09/20  Yes [provider]  metoprolol  succinate (TOPROL -XL) 100 MG 24 hr tablet Take 50 mg by mouth daily.   Yes [provider]  pregabalin  (LYRICA ) 200 MG capsule Take 1 capsule (200 mg total) by mouth 2 (two) times daily. 06/29/19  Yes Hawks, Christy A, FNP  sertraline  (ZOLOFT ) 100 MG tablet Take 1 tablet (100  mg total) by mouth daily. 06/29/19  Yes Hawks, Bari A, FNP    Review of imaging studies   #1 no evidence of PTE on the CT angio  #2 Head CT shows no acute head trauma  3.  Chest x-ray shows old rib fractures moderate cardiomegaly no active lung disease  Independent review of the imaging shows a right femoral neck fracture with lumbar spine degenerative changes   Reports from radiology CT Angio Chest PE W/Cm &/Or Wo Cm Result Date: 03/13/2024 CLINICAL DATA:  Status post fall.  Hypoxia. EXAM: CT ANGIOGRAPHY CHEST WITH CONTRAST TECHNIQUE: Multidetector CT imaging of the chest was performed using the standard protocol during bolus administration of intravenous contrast. Multiplanar CT image reconstructions and MIPs were obtained to evaluate the vascular  anatomy. RADIATION DOSE REDUCTION: This exam was performed according to the departmental dose-optimization program which includes automated exposure control, adjustment of the mA and/or kV according to patient size and/or use of iterative reconstruction technique. CONTRAST:  75mL OMNIPAQUE  IOHEXOL  350 MG/ML SOLN COMPARISON:  CT chest dated 06/19/2021 FINDINGS: Cardiovascular: The study is high quality for the evaluation of pulmonary embolism. There are no filling defects in the central, lobar, segmental or subsegmental pulmonary artery branches to suggest acute pulmonary embolism. Heterogeneous enhancement of a right middle lobe segmental pulmonary artery (5:56 in an area of motion artifact. Great vessels are normal in course and caliber. Dilated main pulmonary artery measures 3.6 cm. Multichamber cardiomegaly. No significant pericardial fluid/thickening. Coronary artery calcifications and aortic atherosclerosis. Reflux of contrast material into the hepatic veins, suggesting a degree of right heart dysfunction. Mediastinum/Nodes: Imaged thyroid  gland without nodules meeting criteria for imaging follow-up by size. Normal esophagus. 11 mm precarinal lymph  node (5:40), previously 9 mm, and 14 mm subcarinal lymph node (5:58), previously 10 mm. Lungs/Pleura: The central airways are patent. Adherent secretions within the trachea. Irregular consolidation within the dependent right upper lobe. Diffuse mosaic attenuation. Mild diffuse bronchial wall thickening with subsegmental mucous plugging. No pneumothorax. No pleural effusion. Upper abdomen: Normal. Musculoskeletal: No acute or abnormal lytic or blastic osseous lesions. Partially imaged inferior endplate compression of T12. Review of the MIP images confirms the above findings. IMPRESSION: 1. No evidence of acute pulmonary embolism. Heterogeneous enhancement of a right middle lobe segmental pulmonary artery in an area of motion artifact is favored to be artifactual. 2. Irregular consolidation within the dependent right upper lobe, suspicious for aspiration. 3. Diffuse mosaic attenuation, which can be seen in the setting of small airways disease. 4. Mildly enlarged mediastinal lymph nodes, likely reactive. 5. Dilated main pulmonary artery, which can be seen in the setting of pulmonary arterial hypertension. 6. Multichamber cardiomegaly with reflux of contrast material into the hepatic veins, suggesting a degree of right heart dysfunction. 7.  Aortic Atherosclerosis (ICD10-I70.0). Electronically Signed   By: Limin  Xu M.D.   On: 03/13/2024 15:25   CT Head Wo Contrast Result Date: 03/13/2024 EXAM: CT HEAD WITHOUT CONTRAST 03/13/2024 02:45:58 PM TECHNIQUE: CT of the head was performed without the administration of intravenous contrast. Automated exposure control, iterative reconstruction, and/or weight based adjustment of the mA/kV was utilized to reduce the radiation dose to as low as reasonably achievable. COMPARISON: None available. CLINICAL HISTORY: Head trauma, minor (Age >= 65y). FINDINGS: BRAIN AND VENTRICLES: No acute hemorrhage. No evidence of acute infarct. No hydrocephalus. No extra-axial collection. No mass  effect or midline shift. Age-related cerebral atrophy. Periventricular white matter changes, likely sequela of chronic small vessel ischemic disease. Remote left cerebellar infarct. Remote lacunar infarcts in the bilateral basal ganglia. Atherosclerotic calcifications in intracranial carotid and vertebral arteries. ORBITS: Bilateral lens replacements. SINUSES: Near complete opacification of right maxillary sinus with osseous thickening, likely chronic sinusitis. Mucosal thickening in ethmoid air cells. SOFT TISSUES AND SKULL: No acute soft tissue abnormality. No skull fracture. IMPRESSION: 1. No acute intracranial abnormality. 2. Age-related cerebral atrophy, chronic small vessel ischemic disease, and remote left cerebellar infarct. 3. Near complete opacification of the right maxillary sinus with osseous thickening, likely chronic sinusitis, with additional mucosal thickening in the ethmoid air cells. Electronically signed by: Donnice Mania MD 03/13/2024 03:08 PM EST RP Workstation: [id number]   DG Chest 1 View Result Date: 03/13/2024 CLINICAL DATA:  Right hip fracture.  Pre-op clearance exam EXAM: CHEST  1  VIEW COMPARISON:  03/08/2022 FINDINGS: Moderate cardiomegaly again noted as well as ectasia of the thoracic aorta. Both lungs are clear. Several old right rib fracture deformities are noted. IMPRESSION: Moderate cardiomegaly. No active lung disease. Electronically Signed   By: Norleen DELENA Kil M.D.   On: 03/13/2024 13:15   DG Hip Unilat With Pelvis 2-3 Views Right Result Date: 03/13/2024 CLINICAL DATA:  Fall.  Right hip pain. EXAM: DG HIP (WITH OR WITHOUT PELVIS) 3V RIGHT COMPARISON:  None Available. FINDINGS: Displaced and impacted right femoral neck fracture is seen. No dislocation. Osteopenia noted. Degenerative changes are seen involving the pubic symphysis and lower lumbar spine. IMPRESSION: Displaced and impacted right femoral neck fracture. Electronically Signed   By: Norleen DELENA Kil M.D.   On: 03/13/2024  13:14   Family History Reviewed and non-contributory, no pertinent history of problems with bleeding or anesthesia    Review of Systems No fevers or chills No numbness or tingling No chest pain No shortness of breath No bowel or bladder dysfunction No GI distress No headaches Musculoskeletal history of back pain   OBJECTIVE  Vitals:Patient Vitals for the past 8 hrs:  BP Temp Temp src Pulse Resp SpO2 Height Weight  03/14/24 0938 (!) 176/107 -- Oral 83 -- 97 % 5' 5 (1.651 m) 79.4 kg  03/14/24 0343 (!) 140/74 98.5 F (36.9 C) Oral (!) 59 18 94 % -- --   General: Alert, no acute distress Cardiovascular: Warm extremities noted Respiratory: No cyanosis, no use of accessory musculature GI: No organomegaly, abdomen is soft and non-tender Skin: No lesions in the area of chief complaint other than those listed below in MSK exam.  Neurologic: Sensation intact distally save for the below mentioned MSK exam Psychiatric: Patient is competent for consent with normal mood and affect Lymphatic: No swelling obvious and reported other than the area involved in the exam below Extremities   Right and left upper extremity show no clubbing cyanosis or edema no tremor no tenderness, normal range of motion without contracture or dislocation or subluxation normal muscle tone  Left lower extremity skin is intact tenderness none.  Range of motion normal.  Instability test normal.  Muscle tone normal.  Right lower extremity Skin is intact  Thigh and proximal femur tender to palpation Patient is unable to move the right hip or thigh No instability knee or ankle x-ray confirms hip no evidence of dislocation Muscle tone is normal The limb lies shortened and externally rotated   Test Results  Preop hemoglobin 11.5  Labs cbc Recent Labs    03/13/24 1218 03/14/24 0516  WBC 11.5* 11.9*  HGB 14.7 13.3  HCT 45.0 41.3  PLT 145* 141*    Labs inflam No results for input(s): CRP in the last  72 hours.  Invalid input(s): ESR  Labs coag Recent Labs    03/13/24 1218 03/14/24 0516  INR 1.3* 1.2    Recent Labs    03/13/24 1218 03/14/24 0516  NA 138 141  K 3.8 3.5  CL 101 105  CO2 22 28  GLUCOSE 130* 112*  BUN 17 17  CREATININE 0.69 0.63  CALCIUM  8.9 8.3*

## 2024-03-14 NOTE — H&P (View-Only) (Signed)
 ORTHOPAEDIC CONSULTATION  REQUESTING PHYSICIAN: [name], MD  ASSESSMENT AND PLAN: 88 y.o. female with the following: Right femoral neck fracture  This patient requires inpatient admission to manage this problem appropriately. Orthopedics recommends admission to a medical service and we will provide consultation and follow along  The procedure has been fully reviewed with the patient; The risks and benefits of surgery have been discussed and explained and understood. Alternative treatment has also been reviewed, questions were encouraged and answered. The postoperative plan is also been reviewed. The procedure was discussed with the patient the patient's son and the patient's daughter who was on the telephone  The best course of action is to proceed with a bipolar hip replacement based on patient's age and preinjury functional level  Specific complications associated with bipolar hip replacement in the hip fracture setting include but are not limited to bleeding infection blood clot leg length discrepancy persistent limp thigh pain there is a decreased risk of dislocation compared to total hip   Chief Complaint: Pain right hip  HPI: Mary Marks is a 88 y.o. female with right femoral neck fracture.  Admitted on 03/13/2024.  Dr.Cairns is on-call for orthopedics but asked me to take the patient to expedite her surgical repair of the right hip.  The patient simply slipped out of a chair landing on her right side fracturing her right hip.  She complained of severe right hip pain which was nonradiating and it did prevent her from ambulating or standing on the right side.  She was brought to the hospital for evaluation  She was found to have a right femoral neck fracture and it is require repair  Past Medical History:  Diagnosis Date   Anxiety    Arthritis    in knees   Basal cell carcinoma 07/26/2019   nodular on right lower back - CX3+5FU   Cancer (HCC)    Melanoma on back    Depression    Dyspnea    Hyperlipidemia    Hypertension    Hypothyroidism    Melanoma (HCC)    left post shoulder   Squamous cell carcinoma of skin 03/20/1999   well diff-lower bulb nose  (txpb)   Thyroid  disease    Hypothyroid   Past Surgical History:  Procedure Laterality Date   DILATION AND CURETTAGE OF UTERUS     EXCISION OF BACK LESION     Melanoma   LARYNGOPLASTY Left 08/08/2021   Procedure: LEFT MEDIALIZATION LARYNGOPLASTY;  Surgeon: Carlie Clark, MD;  Location: [facility] OR;  Service: ENT;  Laterality: Left;  RNFA   TUBAL LIGATION  1980   Social History   Socioeconomic History   Marital status: Widowed    Spouse name: Not on file   Number of children: 2   Years of education: Not on file   Highest education level: Not on file  Occupational History   Not on file  Tobacco Use   Smoking status: Never   Smokeless tobacco: Never  Vaping Use   Vaping status: Never Used  Substance and Sexual Activity   Alcohol use: Never   Drug use: Never   Sexual activity: Not on file  Other Topics Concern   Not on file  Social History Narrative   Not on file   Social Drivers of Health   Financial Resource Strain: Not on file  Food Insecurity: No Food Insecurity (03/13/2024)   Hunger Vital Sign    Worried About Running Out of Food  in the Last Year: Never true    Ran Out of Food in the Last Year: Never true  Transportation Needs: No Transportation Needs (03/13/2024)   PRAPARE - Administrator, Civil Service (Medical): No    Lack of Transportation (Non-Medical): No  Physical Activity: Not on file  Stress: Not on file  Social Connections: Moderately Isolated (03/13/2024)   Social Connection and Isolation Panel    Frequency of Communication with Friends and Family: More than three times a week    Frequency of Social Gatherings with Friends and Family: Three times a week    Attends Religious Services: More than 4 times per year    Active Member of Clubs or Organizations: No     Attends Banker Meetings: Never    Marital Status: Widowed   History reviewed. No pertinent family history. Allergies  Allergen Reactions   Pravastatin Dermatitis and Rash   Prior to Admission medications   Medication Sig Start Date End Date Taking? Authorizing Provider  ALPRAZolam  (XANAX ) 0.5 MG tablet Take 1 tablet (0.5 mg total) by mouth 2 (two) times daily as needed for anxiety. Patient taking differently: Take 0.5 mg by mouth 3 (three) times daily. 09/08/19  Yes Hawks, Christy A, FNP  Aspirin-Caffeine (BC FAST PAIN RELIEF PO) Take 1 Package by mouth 2 (two) times daily as needed (pain).   Yes [provider]  atorvastatin  (LIPITOR) 20 MG tablet Take 1 tablet (20 mg total) by mouth daily. 06/29/19  Yes Hawks, Christy A, FNP  cetirizine (ZYRTEC) 10 MG tablet Take 10 mg by mouth daily. 10/25/19  Yes [provider]  cholecalciferol  (VITAMIN D3) 25 MCG (1000 UNIT) tablet Take 1,000 Units by mouth daily.   Yes [provider]  fluticasone  (CUTIVATE ) 0.05 % cream Apply 1 Application topically daily as needed (psoriasis). 03/01/24  Yes [provider]  furosemide  (LASIX ) 20 MG tablet TAKE 1 TABLET EVERY DAY Patient taking differently: Take 20 mg by mouth daily as needed for edema. 11/06/19  Yes Hawks, Christy A, FNP  hydrocortisone cream 1 % Apply 1 application. topically daily as needed for itching (psoriasis).   Yes [provider]  levothyroxine  (SYNTHROID ) 112 MCG tablet Take 112 mcg by mouth every morning. 07/14/21  Yes [provider]  lisinopril  (ZESTRIL ) 40 MG tablet Take 40 mg by mouth daily. 02/09/20  Yes [provider]  metoprolol  succinate (TOPROL -XL) 100 MG 24 hr tablet Take 50 mg by mouth daily.   Yes [provider]  pregabalin  (LYRICA ) 200 MG capsule Take 1 capsule (200 mg total) by mouth 2 (two) times daily. 06/29/19  Yes Hawks, Christy A, FNP  sertraline  (ZOLOFT ) 100 MG tablet Take 1 tablet (100  mg total) by mouth daily. 06/29/19  Yes Hawks, Bari A, FNP    Review of imaging studies   #1 no evidence of PTE on the CT angio  #2 Head CT shows no acute head trauma  3.  Chest x-ray shows old rib fractures moderate cardiomegaly no active lung disease  Independent review of the imaging shows a right femoral neck fracture with lumbar spine degenerative changes   Reports from radiology CT Angio Chest PE W/Cm &/Or Wo Cm Result Date: 03/13/2024 CLINICAL DATA:  Status post fall.  Hypoxia. EXAM: CT ANGIOGRAPHY CHEST WITH CONTRAST TECHNIQUE: Multidetector CT imaging of the chest was performed using the standard protocol during bolus administration of intravenous contrast. Multiplanar CT image reconstructions and MIPs were obtained to evaluate the vascular  anatomy. RADIATION DOSE REDUCTION: This exam was performed according to the departmental dose-optimization program which includes automated exposure control, adjustment of the mA and/or kV according to patient size and/or use of iterative reconstruction technique. CONTRAST:  75mL OMNIPAQUE  IOHEXOL  350 MG/ML SOLN COMPARISON:  CT chest dated 06/19/2021 FINDINGS: Cardiovascular: The study is high quality for the evaluation of pulmonary embolism. There are no filling defects in the central, lobar, segmental or subsegmental pulmonary artery branches to suggest acute pulmonary embolism. Heterogeneous enhancement of a right middle lobe segmental pulmonary artery (5:56 in an area of motion artifact. Great vessels are normal in course and caliber. Dilated main pulmonary artery measures 3.6 cm. Multichamber cardiomegaly. No significant pericardial fluid/thickening. Coronary artery calcifications and aortic atherosclerosis. Reflux of contrast material into the hepatic veins, suggesting a degree of right heart dysfunction. Mediastinum/Nodes: Imaged thyroid  gland without nodules meeting criteria for imaging follow-up by size. Normal esophagus. 11 mm precarinal lymph  node (5:40), previously 9 mm, and 14 mm subcarinal lymph node (5:58), previously 10 mm. Lungs/Pleura: The central airways are patent. Adherent secretions within the trachea. Irregular consolidation within the dependent right upper lobe. Diffuse mosaic attenuation. Mild diffuse bronchial wall thickening with subsegmental mucous plugging. No pneumothorax. No pleural effusion. Upper abdomen: Normal. Musculoskeletal: No acute or abnormal lytic or blastic osseous lesions. Partially imaged inferior endplate compression of T12. Review of the MIP images confirms the above findings. IMPRESSION: 1. No evidence of acute pulmonary embolism. Heterogeneous enhancement of a right middle lobe segmental pulmonary artery in an area of motion artifact is favored to be artifactual. 2. Irregular consolidation within the dependent right upper lobe, suspicious for aspiration. 3. Diffuse mosaic attenuation, which can be seen in the setting of small airways disease. 4. Mildly enlarged mediastinal lymph nodes, likely reactive. 5. Dilated main pulmonary artery, which can be seen in the setting of pulmonary arterial hypertension. 6. Multichamber cardiomegaly with reflux of contrast material into the hepatic veins, suggesting a degree of right heart dysfunction. 7.  Aortic Atherosclerosis (ICD10-I70.0). Electronically Signed   By: Limin  Xu M.D.   On: 03/13/2024 15:25   CT Head Wo Contrast Result Date: 03/13/2024 EXAM: CT HEAD WITHOUT CONTRAST 03/13/2024 02:45:58 PM TECHNIQUE: CT of the head was performed without the administration of intravenous contrast. Automated exposure control, iterative reconstruction, and/or weight based adjustment of the mA/kV was utilized to reduce the radiation dose to as low as reasonably achievable. COMPARISON: None available. CLINICAL HISTORY: Head trauma, minor (Age >= 65y). FINDINGS: BRAIN AND VENTRICLES: No acute hemorrhage. No evidence of acute infarct. No hydrocephalus. No extra-axial collection. No mass  effect or midline shift. Age-related cerebral atrophy. Periventricular white matter changes, likely sequela of chronic small vessel ischemic disease. Remote left cerebellar infarct. Remote lacunar infarcts in the bilateral basal ganglia. Atherosclerotic calcifications in intracranial carotid and vertebral arteries. ORBITS: Bilateral lens replacements. SINUSES: Near complete opacification of right maxillary sinus with osseous thickening, likely chronic sinusitis. Mucosal thickening in ethmoid air cells. SOFT TISSUES AND SKULL: No acute soft tissue abnormality. No skull fracture. IMPRESSION: 1. No acute intracranial abnormality. 2. Age-related cerebral atrophy, chronic small vessel ischemic disease, and remote left cerebellar infarct. 3. Near complete opacification of the right maxillary sinus with osseous thickening, likely chronic sinusitis, with additional mucosal thickening in the ethmoid air cells. Electronically signed by: Donnice Mania MD 03/13/2024 03:08 PM EST RP Workstation: [id number]   DG Chest 1 View Result Date: 03/13/2024 CLINICAL DATA:  Right hip fracture.  Pre-op clearance exam EXAM: CHEST  1  VIEW COMPARISON:  03/08/2022 FINDINGS: Moderate cardiomegaly again noted as well as ectasia of the thoracic aorta. Both lungs are clear. Several old right rib fracture deformities are noted. IMPRESSION: Moderate cardiomegaly. No active lung disease. Electronically Signed   By: Norleen DELENA Kil M.D.   On: 03/13/2024 13:15   DG Hip Unilat With Pelvis 2-3 Views Right Result Date: 03/13/2024 CLINICAL DATA:  Fall.  Right hip pain. EXAM: DG HIP (WITH OR WITHOUT PELVIS) 3V RIGHT COMPARISON:  None Available. FINDINGS: Displaced and impacted right femoral neck fracture is seen. No dislocation. Osteopenia noted. Degenerative changes are seen involving the pubic symphysis and lower lumbar spine. IMPRESSION: Displaced and impacted right femoral neck fracture. Electronically Signed   By: Norleen DELENA Kil M.D.   On: 03/13/2024  13:14   Family History Reviewed and non-contributory, no pertinent history of problems with bleeding or anesthesia    Review of Systems No fevers or chills No numbness or tingling No chest pain No shortness of breath No bowel or bladder dysfunction No GI distress No headaches Musculoskeletal history of back pain   OBJECTIVE  Vitals:Patient Vitals for the past 8 hrs:  BP Temp Temp src Pulse Resp SpO2 Height Weight  03/14/24 0938 (!) 176/107 -- Oral 83 -- 97 % 5' 5 (1.651 m) 79.4 kg  03/14/24 0343 (!) 140/74 98.5 F (36.9 C) Oral (!) 59 18 94 % -- --   General: Alert, no acute distress Cardiovascular: Warm extremities noted Respiratory: No cyanosis, no use of accessory musculature GI: No organomegaly, abdomen is soft and non-tender Skin: No lesions in the area of chief complaint other than those listed below in MSK exam.  Neurologic: Sensation intact distally save for the below mentioned MSK exam Psychiatric: Patient is competent for consent with normal mood and affect Lymphatic: No swelling obvious and reported other than the area involved in the exam below Extremities   Right and left upper extremity show no clubbing cyanosis or edema no tremor no tenderness, normal range of motion without contracture or dislocation or subluxation normal muscle tone  Left lower extremity skin is intact tenderness none.  Range of motion normal.  Instability test normal.  Muscle tone normal.  Right lower extremity Skin is intact  Thigh and proximal femur tender to palpation Patient is unable to move the right hip or thigh No instability knee or ankle x-ray confirms hip no evidence of dislocation Muscle tone is normal The limb lies shortened and externally rotated   Test Results  Preop hemoglobin 11.5  Labs cbc Recent Labs    03/13/24 1218 03/14/24 0516  WBC 11.5* 11.9*  HGB 14.7 13.3  HCT 45.0 41.3  PLT 145* 141*    Labs inflam No results for input(s): CRP in the last  72 hours.  Invalid input(s): ESR  Labs coag Recent Labs    03/13/24 1218 03/14/24 0516  INR 1.3* 1.2    Recent Labs    03/13/24 1218 03/14/24 0516  NA 138 141  K 3.8 3.5  CL 101 105  CO2 22 28  GLUCOSE 130* 112*  BUN 17 17  CREATININE 0.69 0.63  CALCIUM  8.9 8.3*

## 2024-03-14 NOTE — Plan of Care (Signed)

## 2024-03-14 NOTE — Plan of Care (Signed)

## 2024-03-14 NOTE — Interval H&P Note (Signed)
 History and Physical Interval Note:  03/14/2024 10:30 AM  Mary Marks  has presented today for surgery, with the diagnosis of right femoral neck fracture.  The various methods of treatment have been discussed with the patient and family. After consideration of risks, benefits and other options for treatment, the patient has consented to  Procedure(s): HEMIARTHROPLASTY (BIPOLAR) HIP, LATERAL APPROACH FOR FRACTURE (Right) as a surgical intervention.  The patient's history has been reviewed, patient examined, no change in status, stable for surgery.  I have reviewed the patient's chart and labs.  Questions were answered to the patient's satisfaction.    BP (!) 176/107   Pulse 83   Temp 98.5 F (36.9 C) (Oral)   Resp 18   Ht 5' 5 (1.651 m)   Wt 79.4 kg   SpO2 97%   BMI 29.12 kg/m     Latest Ref Rng & Units 03/14/2024    5:16 AM 03/13/2024   12:18 PM 08/04/2021    3:00 PM  CBC  WBC 4.0 - 10.5 K/uL 11.9  11.5  6.6   Hemoglobin 12.0 - 15.0 g/dL 86.6  85.2  85.7   Hematocrit 36.0 - 46.0 % 41.3  45.0  43.3   Platelets 150 - 400 K/uL 141  145  221       Latest Ref Rng & Units 03/14/2024    5:16 AM 03/13/2024   12:18 PM 08/04/2021    3:00 PM  BMP  Glucose 70 - 99 mg/dL 887  869  896   BUN 8 - 23 mg/dL 17  17  13    Creatinine 0.44 - 1.00 mg/dL 9.36  9.30  9.18   Sodium 135 - 145 mmol/L 141  138  141   Potassium 3.5 - 5.1 mmol/L 3.5  3.8  4.1   Chloride 98 - 111 mmol/L 105  101  105   CO2 22 - 32 mmol/L 28  22  29    Calcium  8.9 - 10.3 mg/dL 8.3  8.9  9.3      Taft Minerva

## 2024-03-14 NOTE — Anesthesia Procedure Notes (Signed)
 Spinal  Start time: 03/14/2024 11:00 AM End time: 03/14/2024 11:12 AM Staffing Performed: anesthesiologist  Anesthesiologist: Kendell Yvonna PARAS, MD Resident/CRNA: Cordella Elvie HERO, CRNA Performed by: Cordella Elvie HERO, CRNA Authorized by: Kendell Yvonna PARAS, MD   Preanesthetic Checklist Completed: patient identified, IV checked, site marked, risks and benefits discussed, surgical consent, monitors and equipment checked, pre-op evaluation and timeout performed Spinal Block Prep: ChloraPrep Patient monitoring: heart rate, cardiac monitor, continuous pulse ox and blood pressure Approach: midline Location: L3-4 Injection technique: single-shot Needle Needle type: Pencan  Needle gauge: 24 G Needle length: 10 cm Assessment Sensory level: T6 Additional Notes Attempted by CANDIE Cordella, unable to place spinal, Dr. Rebecka called and came in to place spinal, was able to place at 1111 with spinal fluid achieved, atraumatic placement.

## 2024-03-14 NOTE — Progress Notes (Unsigned)
 Patient in surgery (hip Fx) at 1115.                    Aida Pizza, RCS

## 2024-03-14 NOTE — Op Note (Signed)
 Orthopaedic Surgery Operative Note (CSN: [id number])  Mary Marks  Aug 31, 1935 Date of Surgery: 03/14/2024   Diagnoses:  right femoral neck fracture pre and postop the same  Procedure: Bipolar replacement right hip   TXA [used/not used] used   Operative Finding Complete femoral neck fracture displaced mild OA weightbearing area acetabulum   Post-Op Diagnosis: Same Surgeons:Primary: Margrette Taft BRAVO, MD Assistants: Levon Constant Location: [facility] OR ROOM 4 Anesthesia: Spinal Antibiotics: Ancef  2 g  Estimated Blood Loss: 100 Complications: None correct Specimens: None correct   Implants: Implant Name Type Inv. Item Serial No. Manufacturer Lot No. LRB No. Used Action  STEM FEMORAL SZ STD ACTIS - [id number] Stem STEM FEMORAL SZ STD ACTIS  [employer] [id number] Right 1 Implanted  HEAD BIPOLAR DEPUY 48 - [id number] Hips HEAD BIPOLAR DEPUY 48  [employer] [id number] Right 1 Implanted  HEAD FEM STD 28X+8.5 STRL - [id number] Hips HEAD FEM STD 28X+8.5 STRL  [employer] [id number] Right 1 Implanted     Indication for surgery completely displaced right hip fracture   Transexamic  acid was given yes   ASSISTANTS: Levon Constant  ANESTHESIA:   Spinal  BLOOD ADMINISTERED: None  DRAINS: none   LOCAL MEDICATIONS USED: Arcane with epi   SPECIMEN:  No Specimen  DISPOSITION OF SPECIMEN:  N/A  COUNTS: correct  DICTATION: .Dragon Dictation   The patient was taken to the recovery room in stable condition  PLAN OF CARE: Routine  PATIENT DISPOSITION:  PACU - hemodynamically stable.   Delay start of Pharmacological VTE agent (>24hrs) due to surgical blood loss or risk of bleeding: Yes  Details of surgery: The patient was identified by 2 approved identification mechanisms. The operative extremity was evaluated and found to be acceptable for surgical treatment today. The chart was reviewed. The surgical site was confirmed initials were placed   The patient  was taken to the operating room and given 2 g Ancef  IV. This is consistent with the SCIP protocol.  The patient was given the following anesthetic: Spinal  Foley catheter insertion was completed  The patient was then placed in the lateral decubitus position with appropriate padding. The surgical site was prepped and draped sterilely.  Timeout was executed confirming the patient's name, surgical site, antibiotic administration, x-rays available, and implants were checked and were available.  Incision was made over the greater trochanter extended proximally and distally approximately 3 cm  Subcutaneous tissue was divided down to fascia which was then split in line with the skin incision and deep retractors were placed.  The greater trochanteric bursa was resected exposing the abductors   The gluteus medius anterior half was subperiosteally dissected from the greater trochanter and tagged with #1 Vicryl sutures  The underlying gluteus minimus was split in continuity with the capsule and preserved tagging with Vicryl sutures as well  2 wing retractors were then placed in the pelvis to retract the soft tissue.  Remaining anterior capsule preserved tagged with Vicryl sutures.   The femoral head was removed and measured 48  The acetabular was inspected: Mild OA superior area approximately 1-1/2 cm area weightbearing dome  The hip was dislocated anteriorly into a sterile bag  Proximal femur was prepared starting with a femoral neck cutting guide, box osteotome, and further preparation per manufacture technique  Broaching was started with a size 1 and broached up to appropriate size based on proximal fit and fill.  This was a size 6  Trial reduction was then performed using a  48 head +5 neck 6 stem after trial reduction I increased the neck length to 8.5  Repeat trial reduction we found the hip to be stable in all positions  The trial implants were then removed 3 drill holes were placed  in the greater trochanter and a  2 #5 Ethibond was passed through the drill holes and the abductor musculature tendon   The acetabulum and the femoral canal was irrigated and cleaned of any bony debris, the  implants were placed and the hip was reduced  The hip was stable throughout the range of motion.    Hip flexion  Hip extension with external rotation  Sleep position stress test  Shuck test  Leg lengths were equal I hope  Local anesthetic  was injected in the soft tissues including the abductors and vastus lateralis, the capsule was closed with #1 Vicryl and the abductors were repaired using the #5 Ethibond and then oversewn with #1 Braylon  The hip was then abducted the fascia was closed with #1 Braylon  Subcutaneous tissues were closed with 0 Monocryl and 2-0 Monocryl and staples  A sterile dressing was applied  The patient was taken to recovery room in stable condition   Postop plan  Weightbearing as tolerated Direct lateral hip precautions DVT prophylaxis for 30 days Remove staples at 12 to 14 days Postop appointment scheduled for 28 days  [id number]

## 2024-03-15 ENCOUNTER — Encounter (HOSPITAL_COMMUNITY): Payer: Self-pay | Admitting: Orthopedic Surgery

## 2024-03-15 ENCOUNTER — Inpatient Hospital Stay (HOSPITAL_COMMUNITY)

## 2024-03-15 DIAGNOSIS — I517 Cardiomegaly: Secondary | ICD-10-CM

## 2024-03-15 LAB — CBC
HCT: 39.6 % (ref 36.0–46.0)
Hemoglobin: 12.7 g/dL (ref 12.0–15.0)
MCH: 29 pg (ref 26.0–34.0)
MCHC: 32.1 g/dL (ref 30.0–36.0)
MCV: 90.4 fL (ref 80.0–100.0)
Platelets: 138 K/uL — ABNORMAL LOW (ref 150–400)
RBC: 4.38 MIL/uL (ref 3.87–5.11)
RDW: 14.6 % (ref 11.5–15.5)
WBC: 12.3 K/uL — ABNORMAL HIGH (ref 4.0–10.5)
nRBC: 0 % (ref 0.0–0.2)

## 2024-03-15 LAB — ECHOCARDIOGRAM COMPLETE
AR max vel: 1.25 cm2
AV Area VTI: 1.28 cm2
AV Area mean vel: 1.27 cm2
AV Mean grad: 7 mmHg
AV Peak grad: 17 mmHg
Ao pk vel: 2.06 m/s
Area-P 1/2: 2.34 cm2
Height: 60 in
S' Lateral: 2.7 cm
Weight: 3082.91 [oz_av]

## 2024-03-15 LAB — BASIC METABOLIC PANEL WITH GFR
Anion gap: 13 (ref 5–15)
BUN: 17 mg/dL (ref 8–23)
CO2: 20 mmol/L — ABNORMAL LOW (ref 22–32)
Calcium: 8 mg/dL — ABNORMAL LOW (ref 8.9–10.3)
Chloride: 108 mmol/L (ref 98–111)
Creatinine, Ser: 0.84 mg/dL (ref 0.44–1.00)
GFR, Estimated: >60 mL/min (ref >60)
Glucose, Bld: 159 mg/dL — ABNORMAL HIGH (ref 70–99)
Potassium: 4.1 mmol/L (ref 3.5–5.1)
Sodium: 141 mmol/L (ref 135–145)

## 2024-03-15 LAB — LACTIC ACID, PLASMA
Lactic Acid, Venous: 3.3 mmol/L (ref 0.5–1.9)
Lactic Acid, Venous: 1.7 mmol/L (ref 0.5–1.9)

## 2024-03-15 LAB — HEMOGLOBIN AND HEMATOCRIT, BLOOD
HCT: 40.1 % (ref 36.0–46.0)
Hemoglobin: 12.4 g/dL (ref 12.0–15.0)

## 2024-03-15 MED ORDER — CHLORHEXIDINE GLUCONATE CLOTH 2 % EX PADS
6.0000 | MEDICATED_PAD | Freq: Every day | CUTANEOUS | Status: DC
  Administered 2024-03-15 – 2024-03-21 (×7): 6 via TOPICAL

## 2024-03-15 MED ORDER — SODIUM CHLORIDE 0.9 % IV BOLUS
500.0000 mL | Freq: Once | INTRAVENOUS | Status: AC
  Administered 2024-03-15: 500 mL via INTRAVENOUS

## 2024-03-15 MED ORDER — MIDODRINE HCL 5 MG PO TABS
5.0000 mg | ORAL_TABLET | Freq: Three times a day (TID) | ORAL | Status: DC
  Administered 2024-03-15 – 2024-03-18 (×9): 5 mg via ORAL
  Filled 2024-03-15 (×11): qty 1

## 2024-03-15 MED ORDER — MIDODRINE HCL 5 MG PO TABS: 5.0000 mg | ORAL_TABLET | Freq: Three times a day (TID) | ORAL | Status: DC

## 2024-03-15 MED ORDER — LACTATED RINGERS IV SOLN: INTRAVENOUS | Status: DC

## 2024-03-15 MED ORDER — SODIUM CHLORIDE 0.9 % IV BOLUS
1000.0000 mL | Freq: Once | INTRAVENOUS | Status: AC
  Administered 2024-03-15: 1000 mL via INTRAVENOUS

## 2024-03-15 NOTE — Progress Notes (Signed)
*  PRELIMINARY RESULTS* Echocardiogram 2D Echocardiogram has been performed.  Mary Marks 03/15/2024, 5:24 PM

## 2024-03-15 NOTE — Progress Notes (Signed)
 OT Cancellation Note  Patient Details Name: Mary Marks MRN: [id number] DOB: 1935/10/18   Cancelled Treatment:    Reason Eval/Treat Not Completed: Medical issues which prohibited therapy. Pt moved to higher level of care and will need a new OT consult to start therapy when the pt is medically stable.   Janita Camberos OT, MOT   Mary Marks 03/15/2024, 12:04 PM

## 2024-03-15 NOTE — Progress Notes (Signed)
 OT Cancellation Note  Patient Details Name: Mary Marks MRN: [id number] DOB: March 05, 1936   Cancelled Treatment:    Reason Eval/Treat Not Completed: Medical issues which prohibited therapy. RN requested to hold evaluation due to pt's low BP. Will attempt later as time permits and pt is more medically appropriate.   Rafia Shedden OT, MOT   Jayson Person 03/15/2024, 9:39 AM

## 2024-03-15 NOTE — Progress Notes (Signed)
 PT Cancellation Note  Patient Details Name: Laureen Frederic MRN: [id number] DOB: [birth year]   Cancelled Treatment:    Reason Eval/Treat Not Completed: Medical issues which prohibited therapy. Patient transferred to a higher level of care and will need new PT consult to start therapy when patient is medically stable.  Thank you.   11:57 AM, 03/15/24 Lynwood Music, MPT Physical Therapist with [facility] [phone] office [phone] mobile phone

## 2024-03-15 NOTE — Progress Notes (Signed)
 Subjective: 1 Day Post-Op Procedure(s) (LRB): HEMIARTHROPLASTY (BIPOLAR) HIP, LATERAL APPROACH FOR FRACTURE (Right) Patient reports pain as moderate.    Objective: Vital signs in last 24 hours: Temp:  [97.6 F (36.4 C)-98.8 F (37.1 C)] 98.8 F (37.1 C) (12/10 0218) Pulse Rate:  [62-95] 62 (12/10 0218) Resp:  [17-23] 20 (12/09 1406) BP: (118-176)/(66-107) 118/73 (12/10 0218) SpO2:  [86 %-99 %] 90 % (12/10 0218) Weight:  [79.4 kg] 79.4 kg (12/09 0938)  Intake/Output from previous day: 12/09 0701 - 12/10 0700 In: 2328.7 [P.O.:240; I.V.:1688.7; IV Piggyback:400] Out: 900 [Urine:650; Blood:250] Intake/Output this shift: No intake/output data recorded.  Recent Labs    03/13/24 1218 03/14/24 0516 03/15/24 0447  HGB 14.7 13.3 12.7   Recent Labs    03/14/24 0516 03/15/24 0447  WBC 11.9* 12.3*  RBC 4.68 4.38  HCT 41.3 39.6  PLT 141* 138*   Recent Labs    03/13/24 1218 03/14/24 0516  NA 138 141  K 3.8 3.5  CL 101 105  CO2 22 28  BUN 17 17  CREATININE 0.69 0.63  GLUCOSE 130* 112*  CALCIUM  8.9 8.3*   Recent Labs    03/13/24 1218 03/14/24 0516  INR 1.3* 1.2    Neurologically intact Neurovascular intact Sensation intact distally Intact pulses distally Dorsiflexion/Plantar flexion intact Incision: scant drainage   Assessment/Plan: 1 Day Post-Op Procedure(s) (LRB): HEMIARTHROPLASTY (BIPOLAR) HIP, LATERAL APPROACH FOR FRACTURE (Right) Advance diet Up with therapy D/C IV fluids      Taft Minerva 03/15/2024, 7:59 AM

## 2024-03-15 NOTE — Progress Notes (Signed)
 [facility] PROGRESS NOTE  Mary Marks (DOB: 29-Sep-1935) FMW:[id number] PCP: [name] [name], FNP  Brief Narrative: Mary Marks is a 88 y.o. female with a history of OA, HTN, HLD, hypothyroidism who presented to the ED on 03/13/2024 after a fall at home, found to have a displaced right femoral neck fracture. Also hypoxemic with workup suggesting aspiration pneumonia, cardiomegaly, dilated pulmonary artery by CTA. Echocardiogram pending.   Subjective: Patient seen and examined at the bedside earlier today.  Having having issues with low blood pressure improved with IV fluid boluses.  Patient denied dizziness, was alert and oriented.  Patient will be transferred to stepdown for close monitoring.  Objective: BP 101/63   Pulse 68   Temp 97.9 F (36.6 C) (Oral)   Resp 15   Ht 5' (1.524 m)   Wt 87.4 kg   SpO2 96%   BMI 37.63 kg/m   Gen: Elderly female in no distress Pulm: Clear, nonlabored  CV: Regular, no murmur  GI: Soft, NT, ND, +BS Neuro: Alert and oriented. No new focal deficits. Ext: Warm, dry, palpable RLE pulse, SILT, warm, shortened and externally rotated RLE at the hip Skin: No new rashes, lesions or ulcers on visualized skin   Assessment & Plan: Right femoral neck fracture s/p fall at home:  - S/p repair 12/9 per Dr. Margrette. - Weight bearing recommendations per orthopedics.  - VTE ppx Lovenox  - Pain control recommendations per orthopedics - Get PT/OT evaluations POD #1.    Acute hypoxic respiratory failure: No hx tobacco use, COPD, inhalers. Has a recent cough but clear CXR. No pleuritic chest pain or known DVT/PE risk factors, but hypoxemia was confirmed during my evaluation.  - CTA chest >> personally reviewed, opacity suggestive of aspiration.  Initiated IV Unasyn , will continue - Dilated pulmonary artery and contrast reflux, with cardiomegaly, concern for right-sided heart failure, echocardiogram ordered and pending  Hypotension: -Low blood pressure  noted today which improved with IV fluid bolus.  Hypotension likely from hypovolemia.  Lactic acid was checked and was elevated 3.3.,  Likely secondary to hypotension.  Will transfer patient to stepdown for close monitoring.  Will start gentle IV fluid continuous.  If lactic acid worsens, we will broaden antibiotics  Cardiomegaly, leg swelling: Pt reports chronic leg swelling which is currently improved from baseline. Takes prn lasix , hasn't taken in a while.  - Pro-BNP >> elevated. Gave dose of lasix  - Echo pending.   Hypothyroidism: TSH 3.600.   - Continue synthroid    Anxiety, depression: - Sertraline   - Continue alprazolam  0.5mg  TID prn   HTN:  - Sinus bradycardia noted with 1st deg AVB.  Will avoid beta-blocker. Will discontinue lisinopril  for now due to hypotension   HLD:  - Atorvastatin  20mg , note pravastatin allergy .   Neuropathy, osteoarthritis:  - Tylenol  prn mild pain, continue home lyrica  200mg  BID   Thrombocytopenia:  - Mild, stable. Pending ortho recommendations, will continue heparin  for VTE ppx.    DNR: POA.  - Discussed with patient and son at length at admission. No limitation on care prior to cardiorespiratory arrest (death).   Derryl Duval, MD [facility] [email or web address] 03/15/2024, 3:50 PM

## 2024-03-16 ENCOUNTER — Inpatient Hospital Stay (HOSPITAL_COMMUNITY)

## 2024-03-16 LAB — CBC WITH DIFFERENTIAL/PLATELET
Abs Immature Granulocytes: 0.11 K/uL — ABNORMAL HIGH (ref 0.00–0.07)
Basophils Absolute: 0 K/uL (ref 0.0–0.1)
Basophils Relative: 0 %
Eosinophils Absolute: 0 K/uL (ref 0.0–0.5)
Eosinophils Relative: 0 %
HCT: 38.2 % (ref 36.0–46.0)
Hemoglobin: 11.8 g/dL — ABNORMAL LOW (ref 12.0–15.0)
Immature Granulocytes: 1 %
Lymphocytes Relative: 18 %
Lymphs Abs: 1.8 K/uL (ref 0.7–4.0)
MCH: 28 pg (ref 26.0–34.0)
MCHC: 30.9 g/dL (ref 30.0–36.0)
MCV: 90.7 fL (ref 80.0–100.0)
Monocytes Absolute: 1.2 K/uL — ABNORMAL HIGH (ref 0.1–1.0)
Monocytes Relative: 12 %
Neutro Abs: 7 K/uL (ref 1.7–7.7)
Neutrophils Relative %: 69 %
Platelets: 143 K/uL — ABNORMAL LOW (ref 150–400)
RBC: 4.21 MIL/uL (ref 3.87–5.11)
RDW: 14.9 % (ref 11.5–15.5)
WBC: 10.1 K/uL (ref 4.0–10.5)
nRBC: 0 % (ref 0.0–0.2)

## 2024-03-16 LAB — BASIC METABOLIC PANEL WITH GFR
Anion gap: 7 (ref 5–15)
BUN: 19 mg/dL (ref 8–23)
CO2: 28 mmol/L (ref 22–32)
Calcium: 8.5 mg/dL — ABNORMAL LOW (ref 8.9–10.3)
Chloride: 107 mmol/L (ref 98–111)
Creatinine, Ser: 0.83 mg/dL (ref 0.44–1.00)
GFR, Estimated: >60 mL/min (ref >60)
Glucose, Bld: 82 mg/dL (ref 70–99)
Potassium: 3.6 mmol/L (ref 3.5–5.1)
Sodium: 142 mmol/L (ref 135–145)

## 2024-03-16 NOTE — Progress Notes (Signed)
 Pt desaturating at this time to 80%, This RN came to bedside to find pt without [place] in nose, sitting in chair with IV pulled out and family at bedside. Airplane bottle and shot glass noticed in trash can. Asked family member where this came from and they stated it was empty from their car , asked pt if she had anything to drink, pt reports she has not. All cups removed from bedside and replaced with fresh water. AC and MD Sigdel notified and AC Arlean Mania to bedside.AC discussed at length with family the importance of not bringing contraband into hospital and clarified once again with pt that she had not drank any alcohol. No orders received from MD at this time.

## 2024-03-16 NOTE — Evaluation (Signed)
 Physical Therapy Evaluation Patient Details Name: Mary Marks MRN: [id number] DOB: 1936/03/18 Today's Date: 03/16/2024  History of Present Illness  Mary Marks is an 88 y.o. female with medical history significant of OA, HTN, HLD, hypothyroidism, melanoma who presented to the ED from home where she had fallen. Her son supplements history, found the patient on the ground this morning and review of home camera shows she fell last night. Pt doesn't recall events leading to fall but was too weak to get herself back up off the floor. She had pain in the right hip/leg.                        Pt sustained a fx and recieved a lateral approach THR.  No active abduction x 6 wks  Clinical Impression  PT agreeable to therapy.  Normally lives alone.  Son can help but can not be with pt at all times.  PT has decreased strength, decreased functional ability, decreased ROM and has difficulty in walking.  Recommend SNF        If plan is discharge home, recommend the following: A lot of help with walking and/or transfers;A lot of help with bathing/dressing/bathroom;Assistance with cooking/housework;Assist for transportation;Help with stairs or ramp for entrance   Can travel by private vehicle   No    Equipment Recommendations None recommended by PT  Recommendations for Other Services   OT    Functional Status Assessment Patient has had a recent decline in their functional status and demonstrates the ability to make significant improvements in function in a reasonable and predictable amount of time.     Precautions / Restrictions Precautions Precautions: Fall Precaution/Restrictions Comments: no active hip abduction Restrictions Weight Bearing Restrictions Per Provider Order: Yes RLE Weight Bearing Per Provider Order: Weight bearing as tolerated      Mobility  Bed Mobility Overal bed mobility: Needs Assistance Bed Mobility: Supine to Sit     Supine to sit: Mod assist           Transfers Overall transfer level: Needs assistance Equipment used: Rolling walker (2 wheels) Transfers: Sit to/from Stand, Bed to chair/wheelchair/BSC Sit to Stand: Mod assist   Step pivot transfers: Mod assist       General transfer comment: slow and painful    Ambulation/Gait Ambulation/Gait assistance: Mod assist Gait Distance (Feet): 2 Feet Assistive device: Rolling walker (2 wheels) Gait Pattern/deviations: Decreased step length - right, Decreased step length - left Gait velocity: slow and labourous Gait velocity interpretation: <1.31 ft/sec, indicative of household ambulator         Balance Overall balance assessment: Needs assistance Sitting-balance support: Bilateral upper extremity supported Sitting balance-Leahy Scale: Fair   Postural control: Posterior lean Standing balance support: Bilateral upper extremity supported Standing balance-Leahy Scale: Fair Standing balance comment: fair (-)                             Pertinent Vitals/Pain Pain Assessment Pain Assessment: Faces Faces Pain Scale: Hurts little more Pain Location: noted with activity once pt is seated in chair she appears to be comfortable. Pain Descriptors / Indicators: Sore Pain Intervention(s): Limited activity within patient's tolerance    Home Living Family/patient expects to be discharged to:: Private residence Living Arrangements: Alone Available Help at Discharge: Family;Available PRN/intermittently Type of Home: House Home Access: Level entry       Home Layout: One level Home Equipment: Rollator (4 wheels);Cane -  quad;Grab bars - toilet      Prior Function Prior Level of Function : Independent/Modified Independent             Mobility Comments: Normally ambulates with a walker.  Able to care for basic ADL'S son assists with yard, shopping       Extremity/Trunk Assessment        Lower Extremity Assessment Lower Extremity Assessment: RLE  deficits/detail RLE Deficits / Details: decreased strength generally 2-/5 for hip, 3 for knee and ankle RLE: Unable to fully assess due to pain       Communication   Communication Communication: No apparent difficulties    Cognition Arousal: Alert Behavior During Therapy: WFL for tasks assessed/performed   PT - Cognitive impairments: No apparent impairments                         Following commands: Intact       Cueing Cueing Techniques: Verbal cues            Assessment/Plan    PT Assessment Patient needs continued PT services  PT Problem List Decreased strength;Decreased activity tolerance;Decreased balance;Decreased mobility;Decreased range of motion;Pain       PT Treatment Interventions Gait training;Functional mobility training;Therapeutic exercise;Therapeutic activities    PT Goals (Current goals can be found in the Care Plan section)  Acute Rehab PT Goals Patient Stated Goal: To be walk PT Goal Formulation: With patient Time For Goal Achievement: 03/30/24 Potential to Achieve Goals: Good    Frequency Min 5X/week     Co-evaluation  Yes for safety of pt.              AM-PAC PT 6 Clicks Mobility  Outcome Measure Help needed turning from your back to your side while in a flat bed without using bedrails?: A Little Help needed moving from lying on your back to sitting on the side of a flat bed without using bedrails?: A Little Help needed moving to and from a bed to a chair (including a wheelchair)?: A Lot Help needed standing up from a chair using your arms (e.g., wheelchair or bedside chair)?: A Lot Help needed to walk in hospital room?: A Lot Help needed climbing 3-5 steps with a railing? : A Lot 6 Click Score: 14    End of Session Equipment Utilized During Treatment: Gait belt Activity Tolerance: Patient tolerated treatment well Patient left: in chair;with call bell/phone within reach;with family/visitor present Nurse  Communication: Mobility status PT Visit Diagnosis: Unsteadiness on feet (R26.81);Muscle weakness (generalized) (M62.81);History of falling (Z91.81);Other abnormalities of gait and mobility (R26.89)    Time: 8689-8665 PT Time Calculation (min) (ACUTE ONLY): 24 min   Charges:   PT Evaluation $PT Eval Moderate Complexity: 1 Mod   PT General Charges $$ ACUTE PT VISIT: 1 Visit    Montie Metro, PT CLT [phone]  03/16/2024, 2:16 PM

## 2024-03-16 NOTE — Progress Notes (Signed)
 [facility] PROGRESS NOTE  Mary Marks (DOB: 10/14/1935) FMW:[id number] PCP: [name] [name], FNP  Brief Narrative: Mary Marks is a 88 y.o. female with a history of OA, HTN, HLD, hypothyroidism who presented to the ED on 03/13/2024 after a fall at home, found to have a displaced right femoral neck fracture. Also hypoxemic with workup suggesting aspiration pneumonia, cardiomegaly, dilated pulmonary artery by CTA. Echocardiogram shows right heart dysfunction and severe pulmonary hypertension.  Subjective: Patient seen and examined at the bedside earlier today.  Blood pressure has remained stable today.  She is requiring some supplemental oxygen.  Denies chest pain.  Reports some shortness of breath.  Febrile.  PT/OT could not work with her yesterday due to hypotension issues.  Objective: BP 132/83   Pulse 76   Temp 98.4 F (36.9 C) (Oral)   Resp 15   Ht 5' (1.524 m)   Wt 87.4 kg   SpO2 (!) 83%   BMI 37.63 kg/m   Gen: Elderly female in no distress Pulm: Clear, nonlabored  CV: Regular, no murmur  GI: Soft, NT, ND, +BS Neuro: Alert and oriented. No new focal deficits. Ext: Warm, dry, trace edema bilaterally, thigh dressing intact and clean Skin: No new rashes, lesions or ulcers on visualized skin   Assessment & Plan: Right femoral neck fracture s/p fall at home:  - S/p repair 12/9 per Dr. Margrette. - Weight bearing recommendations per orthopedics.  - VTE ppx Lovenox  - Pain control recommendations per orthopedics - PT/OT evaluations pending.   Acute hypoxic respiratory failure: No hx tobacco use, COPD, inhalers. Has a recent cough but clear CXR. No pleuritic chest pain or known DVT/PE risk factors, CTA negative for PE - CTA chest >> personally reviewed, opacity suggestive of aspiration.  Continue IV Unasyn  - Dilated pulmonary artery and contrast reflux, with cardiomegaly, concern for right-sided heart failure, echocardiogram ordered and pending  Hypotension: Hypotension  12/10 has resolved - Hypotension and lactic acidosis was secondary to hypovolemia. - This has improved with IV fluids. - Will have to monitor closely due to severe right-sided dysfunction.  Pulmonary hypertension  Cardiomegaly, leg swelling: Chronic leg swelling, echocardiogram shows right ventricular dysfunction, severe pulmonary hypertension.  Patient is prescribed as needed Lasix  at home which she has not used recently. -proBNP is elevated more than 7000. Received IV fluid 12/10 due to hypotension. Will obtain chest x-ray today Initiate Lasix  as needed   hypothyroidism: TSH 3.600.   - Continue synthroid    Anxiety, depression: - Sertraline   - Continue alprazolam  0.5mg  TID prn   HTN:  - Sinus bradycardia noted with 1st deg AVB.  Will avoid beta-blocker. Will discontinue lisinopril  for now due to hypotension   HLD:  - Atorvastatin  20mg , note pravastatin allergy .   Neuropathy, osteoarthritis:  - Tylenol  prn mild pain, continue home lyrica  200mg  BID   Thrombocytopenia:  - Mild, stable. Pending ortho recommendations, will continue heparin  for VTE ppx.    DNR:   Discussion: Pending PT/OT evaluation.  Likely will need rehab  Jeet Shough, MD [facility] [email or web address] 03/16/2024, 3:47 PM

## 2024-03-16 NOTE — NC FL2 (Signed)
 [place]  MEDICAID FL2 LEVEL OF CARE FORM     IDENTIFICATION  Patient Name: Mary Marks Birthdate: [birth year] Sex: female Admission Date (Current Location): 03/13/2024  [facility] and [place] Number:  [employer] and Address:  [facility],  [address], [place] [address]      Provider Number: [id number]  Attending Physician Name and Address:  Mcarthur Pick, MD  Relative Name and Phone Number:       Current Level of Care: Hospital Recommended Level of Care: Skilled Nursing Facility Prior Approval Number:    Date Approved/Denied:   PASRR Number: Pending  Discharge Plan: SNF    Current Diagnoses: Patient Active Problem List   Diagnosis Date Noted   Displaced fracture of right femoral neck (HCC) 03/13/2024   Acute hypoxic respiratory failure (HCC) 03/13/2024   Cardiomegaly 03/13/2024   DNR (do not resuscitate) 03/13/2024   Vocal cord paralysis 08/08/2021   Psoriasis 06/29/2019   Vitamin D  deficiency 05/12/2019   Anxiety 05/12/2019   DDD (degenerative disc disease), lumbar 05/12/2019   Hypertension    Hypothyroidism    Depression    Hyperlipidemia    Osteoarthritis of knee 01/24/2018   Osteoarthritis of left knee 06/15/2017    Orientation RESPIRATION BLADDER Height & Weight     Self, Time, Situation, Place  O2 (4L) Incontinent Weight: 192 lb 10.9 oz (87.4 kg) Height:  5' (152.4 cm)  BEHAVIORAL SYMPTOMS/MOOD NEUROLOGICAL BOWEL NUTRITION STATUS      Continent Diet (Regular)  AMBULATORY STATUS COMMUNICATION OF NEEDS Skin   Extensive Assist Verbally Surgical wounds                       Personal Care Assistance Level of Assistance  Bathing, Feeding, Dressing Bathing Assistance: Limited assistance Feeding assistance: Independent Dressing Assistance: Limited assistance     Functional Limitations Info  Sight, Hearing, Speech Sight Info: Adequate Hearing Info: Impaired Speech Info: Adequate    SPECIAL CARE FACTORS FREQUENCY  PT  (By licensed PT), OT (By licensed OT)     PT Frequency: 5x/wk OT Frequency: 5x/wk            Contractures Contractures Info: Not present    Additional Factors Info  Code Status, Allergies, Psychotropic Code Status Info: DNR Allergies Info: Pravastatin Psychotropic Info: Zoloft          Current Medications (03/16/2024):  This is the current hospital active medication list Current Facility-Administered Medications  Medication Dose Route Frequency Provider Last Rate Last Admin   acetaminophen  (TYLENOL ) tablet 325-650 mg  325-650 mg Oral Q6H PRN Harrison, Stanley E, MD       ALPRAZolam  (XANAX ) tablet 0.5 mg  0.5 mg Oral TID PRN Harrison, Stanley E, MD       alum & mag hydroxide-simeth (MAALOX/MYLANTA) 200-200-20 MG/5ML suspension 30 mL  30 mL Oral Q4H PRN Harrison, Stanley E, MD       Ampicillin -Sulbactam (UNASYN ) 3 g in sodium chloride  0.9 % 100 mL IVPB  3 g Intravenous Q6H Margrette Taft BRAVO, MD 200 mL/hr at 03/16/24 1209 3 g at 03/16/24 1209   atorvastatin  (LIPITOR) tablet 20 mg  20 mg Oral Daily Harrison, Stanley E, MD   20 mg at 03/16/24 0802   bisacodyl  (DULCOLAX) suppository 10 mg  10 mg Rectal Daily PRN Harrison, Stanley E, MD       Chlorhexidine  Gluconate Cloth 2 % PADS 6 each  6 each Topical Daily Sigdel, Santosh, MD   6 each at 03/16/24  9195   cholecalciferol  (VITAMIN D3) 25 MCG (1000 UNIT) tablet 1,000 Units  1,000 Units Oral Daily Margrette Taft BRAVO, MD   1,000 Units at 03/16/24 0803   docusate sodium  (COLACE) capsule 100 mg  100 mg Oral BID Margrette Taft BRAVO, MD   100 mg at 03/16/24 0803   enoxaparin  (LOVENOX ) injection 40 mg  40 mg Subcutaneous Q24H Harrison, Stanley E, MD   40 mg at 03/16/24 0802   hydrALAZINE  (APRESOLINE ) injection 10 mg  10 mg Intravenous Q4H PRN Harrison, Stanley E, MD       HYDROcodone -acetaminophen  (NORCO) 7.5-325 MG per tablet 1-2 tablet  1-2 tablet Oral Q4H PRN Harrison, Stanley E, MD   1 tablet at 03/15/24 9182   HYDROcodone -acetaminophen   (NORCO/VICODIN) 5-325 MG per tablet 1-2 tablet  1-2 tablet Oral Q4H PRN Harrison, Stanley E, MD   1 tablet at 03/15/24 1948   Influenza vac split trivalent PF (FLUZONE HIGH-DOSE) injection 0.5 mL  0.5 mL Intramuscular Prior to discharge [name], MD       levothyroxine  (SYNTHROID ) tablet 112 mcg  112 mcg Oral q morning Margrette Taft BRAVO, MD   112 mcg at 03/16/24 9394   methocarbamol  (ROBAXIN ) tablet 500 mg  500 mg Oral Q6H PRN Margrette Taft BRAVO, MD       Or   methocarbamol  (ROBAXIN ) injection 500 mg  500 mg Intravenous Q6H PRN Margrette Taft BRAVO, MD       metoCLOPramide  (REGLAN ) tablet 5-10 mg  5-10 mg Oral Q8H PRN Margrette Taft BRAVO, MD       Or   metoCLOPramide  (REGLAN ) injection 5-10 mg  5-10 mg Intravenous Q8H PRN Margrette Taft BRAVO, MD       midodrine  (PROAMATINE ) tablet 5 mg  5 mg Oral TID WC Sigdel, Santosh, MD   5 mg at 03/16/24 1210   morphine  (PF) 2 MG/ML injection 0.5-1 mg  0.5-1 mg Intravenous Q2H PRN Harrison, Stanley E, MD   1 mg at 03/14/24 2002   ondansetron  (ZOFRAN ) tablet 4 mg  4 mg Oral Q6H PRN Margrette Taft BRAVO, MD       Or   ondansetron  (ZOFRAN ) injection 4 mg  4 mg Intravenous Q6H PRN Harrison, Stanley E, MD   4 mg at 03/15/24 9374   Oral care mouth rinse  15 mL Mouth Rinse PRN Margrette Taft BRAVO, MD       pantoprazole  (PROTONIX ) EC tablet 40 mg  40 mg Oral Daily Harrison, Stanley E, MD   40 mg at 03/16/24 0803   polyethylene glycol (MIRALAX  / GLYCOLAX ) packet 17 g  17 g Oral Daily PRN Margrette Taft BRAVO, MD       pregabalin  (LYRICA ) capsule 200 mg  200 mg Oral BID Harrison, Stanley E, MD   200 mg at 03/16/24 0802   sertraline  (ZOLOFT ) tablet 100 mg  100 mg Oral Daily Margrette Taft BRAVO, MD   100 mg at 03/16/24 0803   sodium chloride  flush (NS) 0.9 % injection 3 mL  3 mL Intravenous Q12H Margrette Taft BRAVO, MD   3 mL at 03/16/24 0804   traMADol  (ULTRAM ) tablet 50 mg  50 mg Oral Q6H Harrison, Stanley E, MD   50 mg at 03/16/24 1210     Discharge  Medications: Please see discharge summary for a list of discharge medications.  Relevant Imaging Results:  Relevant Lab Results:   Additional Information SSN: [id number]  Hoy DELENA Bigness, LCSW

## 2024-03-16 NOTE — Evaluation (Signed)
 Occupational Therapy Evaluation Patient Details Name: Mary Marks MRN: [id number] DOB: Mar 03, 1936 Today's Date: 03/16/2024   History of Present Illness   Mary Marks is an 88 y.o. female with medical history significant of OA, HTN, HLD, hypothyroidism, melanoma who presented to the ED from home where she had fallen. Her son supplements history, found the patient on the ground this morning and review of home camera shows she fell last night. Pt doesn't recall events leading to fall but was too weak to get herself back up off the floor. She had pain in the right hip/leg. Reports she has chronic runny nose and mild cough but no recent changes. No shortness of breath but was hypoxic in the ED. Denies chest pain, sick contacts, wheezing. (per MD)     Clinical Impressions Pt agreeable to OT and PT co-evaluation. Pt is typically independent for ADL's at baseline, but son reports that she has been having more difficulty. Pt required mod A for bed mobility and EOB to chair transfer. Max A needed for majority of lower body ADL tasks. Set up assist for upper body tasks per clinical judgement. Pt left in the chair with call bell within reach and chair alarm set. Pt will benefit from continued OT in the hospital to increase strength, balance, and endurance for safe ADL's.        If plan is discharge home, recommend the following:   A lot of help with walking and/or transfers;A lot of help with bathing/dressing/bathroom;Assistance with cooking/housework;Assist for transportation;Direct supervision/assist for medications management;Help with stairs or ramp for entrance     Functional Status Assessment   Patient has had a recent decline in their functional status and demonstrates the ability to make significant improvements in function in a reasonable and predictable amount of time.     Equipment Recommendations   None recommended by OT             Precautions/Restrictions    Precautions Precautions: Fall Recall of Precautions/Restrictions: Impaired Precaution/Restrictions Comments: Direct lateral precautions. Restrictions Weight Bearing Restrictions Per Provider Order: Yes RLE Weight Bearing Per Provider Order: Weight bearing as tolerated     Mobility Bed Mobility Overal bed mobility: Needs Assistance Bed Mobility: Supine to Sit     Supine to sit: Mod assist          Transfers Overall transfer level: Needs assistance Equipment used: Rolling walker (2 wheels) Transfers: Sit to/from Stand, Bed to chair/wheelchair/BSC Sit to Stand: Mod assist     Step pivot transfers: Mod assist     General transfer comment: EOB to chair with RW. +2 assist for safety.      Balance Overall balance assessment: Needs assistance Sitting-balance support: Bilateral upper extremity supported, Feet supported Sitting balance-Leahy Scale: Fair Sitting balance - Comments: seated at EOB Postural control: Posterior lean Standing balance support: Bilateral upper extremity supported, During functional activity, Reliant on assistive device for balance Standing balance-Leahy Scale: Poor Standing balance comment: poor with RW                           ADL either performed or assessed with clinical judgement   ADL Overall ADL's : Needs assistance/impaired     Grooming: Set up;Sitting   Upper Body Bathing: Set up;Sitting   Lower Body Bathing: Maximal assistance;Sitting/lateral leans   Upper Body Dressing : Set up;Sitting   Lower Body Dressing: Maximal assistance;Sitting/lateral leans   Toilet Transfer: Moderate assistance;Rolling walker (2 wheels);Stand-pivot Statistician Details (  indicate cue type and reason): EOB to chair with RW. Toileting- Clothing Manipulation and Hygiene: Maximal assistance;Sitting/lateral lean;Bed level;Moderate assistance               Vision Baseline Vision/History: 0 No visual deficits Ability to See in Adequate  Light: 0 Adequate Patient Visual Report: No change from baseline Vision Assessment?: No apparent visual deficits     Perception Perception: Not tested       Praxis Praxis: Not tested       Pertinent Vitals/Pain Pain Assessment Pain Assessment: Faces Faces Pain Scale: Hurts little more Pain Location: R hip with movement Pain Descriptors / Indicators: Sore Pain Intervention(s): Monitored during session, Repositioned, Limited activity within patient's tolerance     Extremity/Trunk Assessment Upper Extremity Assessment Upper Extremity Assessment: Generalized weakness   Lower Extremity Assessment Lower Extremity Assessment: Defer to PT evaluation RLE Deficits / Details: decreased strength generally 2-/5 for hip, 3 for knee and ankle RLE: Unable to fully assess due to pain   Cervical / Trunk Assessment Cervical / Trunk Assessment: Normal   Communication Communication Communication: No apparent difficulties   Cognition Arousal: Alert Behavior During Therapy: WFL for tasks assessed/performed Cognition: Cognition impaired             OT - Cognition Comments: Pt mildly confused. Son reports he has to have cameras in the home to monitor the pt at baseline.                 Following commands: Intact       Cueing  General Comments   Cueing Techniques: Verbal cues                 Home Living Family/patient expects to be discharged to:: Private residence Living Arrangements: Alone Available Help at Discharge: Family;Available PRN/intermittently Type of Home: House Home Access: Level entry     Home Layout: Able to live on main level with bedroom/bathroom;Two level     Bathroom Shower/Tub: Tub/shower unit;Walk-in shower   Bathroom Toilet: Standard Bathroom Accessibility: Yes   Home Equipment: Rollator (4 wheels);Cane - Programmer, Applications (2 wheels);Wheelchair - manual;Grab bars - toilet          Prior Functioning/Environment Prior Level of  Function : Independent/Modified Independent             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 ADLs Comments: Independent ADL's, but son reports that she has been having more difficulty. Pt is able to cook. Family gets groceries for her. Pt has a cleaning person.    OT Problem List: Decreased strength;Decreased activity tolerance;Impaired balance (sitting and/or standing);Decreased cognition   OT Treatment/Interventions: Self-care/ADL training;Therapeutic exercise;DME and/or AE instruction;Therapeutic activities;Cognitive remediation/compensation;Patient/family education;Balance training      OT Goals(Current goals can be found in the care plan section)   Acute Rehab OT Goals Patient Stated Goal: Improve function. OT Goal Formulation: With patient/family Time For Goal Achievement: 03/30/24 Potential to Achieve Goals: Good   OT Frequency:  Min 2X/week    Co-evaluation PT/OT/SLP Co-Evaluation/Treatment: Yes Reason for Co-Treatment: To address functional/ADL transfers   OT goals addressed during session: ADL's and self-care                       End of Session Equipment Utilized During Treatment: Gait belt;Rolling walker (2 wheels) Nurse Communication: Mobility status  Activity Tolerance: Patient tolerated treatment well Patient left: in chair;with call bell/phone within reach;with chair alarm set;with family/visitor present  OT Visit Diagnosis: Unsteadiness on feet (  R26.81);Other abnormalities of gait and mobility (R26.89);Muscle weakness (generalized) (M62.81);History of falling (Z91.81)                Time: 1314-1340 OT Time Calculation (min): 26 min Charges:  OT General Charges $OT Visit: 1 Visit OT Evaluation $OT Eval Low Complexity: 1 Low  Bethany Cumming OT, MOT  Jayson Person 03/16/2024, 2:59 PM

## 2024-03-16 NOTE — TOC Progression Note (Addendum)
 Transition of Care [facility]) - Progression Note    Patient Details  Name: Sha Burling MRN: [id number] Date of Birth: 08-29-1935  Transition of [facility]) CM/SW Contact  Hoy DELENA Bigness, LCSW Phone Number: 03/16/2024, 2:38 PM  Clinical Narrative:    Met with pt and son at bedside to discuss recommendation for SNF placement. Pt reports she has not been to SNF in the past. Pt/son would like placement at [facility] for STR. Referral has been sent and currently awaiting bed offer. Pt's PASRR currently pending.    ADDENDUM: Pt/son accepted bed offer for [facility]. Auth to be requested. PASRR still pending.                      Expected Discharge Plan and Services                                               Social Drivers of Health (SDOH) Interventions SDOH Screenings   Food Insecurity: No Food Insecurity (03/13/2024)  Housing: Low Risk (03/13/2024)  Transportation Needs: No Transportation Needs (03/13/2024)  Utilities: Not At Risk (03/13/2024)  Social Connections: Moderately Isolated (03/13/2024)  Tobacco Use: Low Risk (03/14/2024)    Readmission Risk Interventions    03/16/2024    2:38 PM 03/14/2024    9:56 AM  Readmission Risk Prevention Plan  Post Dischage Appt Complete Complete  Medication Screening Complete Complete  Transportation Screening Complete Complete

## 2024-03-16 NOTE — Plan of Care (Signed)
°  Problem: Acute Rehab OT Goals (only OT should resolve) Goal: Pt. Will Perform Grooming Flowsheets (Taken 03/16/2024 1502) Pt Will Perform Grooming:  with contact guard assist  standing Goal: Pt. Will Perform Lower Body Bathing Flowsheets (Taken 03/16/2024 1502) Pt Will Perform Lower Body Bathing:  with min assist  with adaptive equipment  sitting/lateral leans Goal: Pt. Will Perform Lower Body Dressing Flowsheets (Taken 03/16/2024 1502) Pt Will Perform Lower Body Dressing:  with min assist  with adaptive equipment  sitting/lateral leans Goal: Pt. Will Transfer To Toilet Flowsheets (Taken 03/16/2024 1502) Pt Will Transfer to Toilet:  with contact guard assist  stand pivot transfer Goal: Pt. Will Perform Toileting-Clothing Manipulation Flowsheets (Taken 03/16/2024 1502) Pt Will Perform Toileting - Clothing Manipulation and hygiene:  with min assist  sitting/lateral leans Goal: Pt/Caregiver Will Perform Home Exercise Program Flowsheets (Taken 03/16/2024 1502) Pt/caregiver will Perform Home Exercise Program:  Increased strength  Both right and left upper extremity  Independently  Oluwanifemi Petitti OT, MOT

## 2024-03-16 NOTE — Progress Notes (Signed)
 Patient ID: Mary Marks, female   DOB: Oct 03, 1935, 88 y.o.   MRN: [id number]  Chart check and review   This is postop day #2 status post bipolar hip replacement on the right  Patient was admitted to the ICU for low blood pressures  Today her vital signs look pretty good maintaining 100 systolic blood pressure.  Hemoglobin is 11  Medical management per medical service  No restrictions on weightbearing.  Direct lateral hip precautions should be maintained.

## 2024-03-17 ENCOUNTER — Inpatient Hospital Stay (HOSPITAL_COMMUNITY)

## 2024-03-17 ENCOUNTER — Other Ambulatory Visit: Payer: Self-pay

## 2024-03-17 DIAGNOSIS — I50810 Right heart failure, unspecified: Secondary | ICD-10-CM

## 2024-03-17 DIAGNOSIS — J9601 Acute respiratory failure with hypoxia: Secondary | ICD-10-CM

## 2024-03-17 DIAGNOSIS — I272 Pulmonary hypertension, unspecified: Secondary | ICD-10-CM

## 2024-03-17 DIAGNOSIS — I5030 Unspecified diastolic (congestive) heart failure: Secondary | ICD-10-CM

## 2024-03-17 DIAGNOSIS — I959 Hypotension, unspecified: Secondary | ICD-10-CM

## 2024-03-17 LAB — COOXEMETRY PANEL
Carboxyhemoglobin: 2.1 % — ABNORMAL HIGH (ref 0.5–1.5)
Methemoglobin: <0.7 % (ref 0.0–1.5)
O2 Saturation: 65.7 %
Total hemoglobin: 12.3 g/dL (ref 12.0–16.0)

## 2024-03-17 LAB — BASIC METABOLIC PANEL WITH GFR
Anion gap: 13 (ref 5–15)
Anion gap: 14 (ref 5–15)
BUN: 15 mg/dL (ref 8–23)
BUN: 13 mg/dL (ref 8–23)
CO2: 23 mmol/L (ref 22–32)
CO2: 31 mmol/L (ref 22–32)
Calcium: 8.3 mg/dL — ABNORMAL LOW (ref 8.9–10.3)
Calcium: 8.4 mg/dL — ABNORMAL LOW (ref 8.9–10.3)
Chloride: 106 mmol/L (ref 98–111)
Chloride: 98 mmol/L (ref 98–111)
Creatinine, Ser: 0.58 mg/dL (ref 0.44–1.00)
Creatinine, Ser: 0.6 mg/dL (ref 0.44–1.00)
GFR, Estimated: >60 mL/min (ref >60)
GFR, Estimated: >60 mL/min (ref >60)
Glucose, Bld: 90 mg/dL (ref 70–99)
Glucose, Bld: 104 mg/dL — ABNORMAL HIGH (ref 70–99)
Potassium: 3.7 mmol/L (ref 3.5–5.1)
Potassium: 3 mmol/L — ABNORMAL LOW (ref 3.5–5.1)
Sodium: 141 mmol/L (ref 135–145)
Sodium: 143 mmol/L (ref 135–145)

## 2024-03-17 LAB — MAGNESIUM: Magnesium: 1.7 mg/dL (ref 1.7–2.4)

## 2024-03-17 LAB — BLOOD GAS, VENOUS
Acid-Base Excess: 16.5 mmol/L — ABNORMAL HIGH (ref 0.0–2.0)
Bicarbonate: 42.7 mmol/L — ABNORMAL HIGH (ref 20.0–28.0)
Drawn by: 1517
O2 Saturation: 69 %
Patient temperature: 36.9
pCO2, Ven: 56 mmHg (ref 44–60)
pH, Ven: 7.49 — ABNORMAL HIGH (ref 7.25–7.43)
pO2, Ven: 36 mmHg (ref 32–45)

## 2024-03-17 LAB — LACTIC ACID, PLASMA: Lactic Acid, Venous: 1.1 mmol/L (ref 0.5–1.9)

## 2024-03-17 LAB — CBC
HCT: 35.7 % — ABNORMAL LOW (ref 36.0–46.0)
Hemoglobin: 11.2 g/dL — ABNORMAL LOW (ref 12.0–15.0)
MCH: 28.6 pg (ref 26.0–34.0)
MCHC: 31.4 g/dL (ref 30.0–36.0)
MCV: 91.1 fL (ref 80.0–100.0)
Platelets: 138 K/uL — ABNORMAL LOW (ref 150–400)
RBC: 3.92 MIL/uL (ref 3.87–5.11)
RDW: 15 % (ref 11.5–15.5)
WBC: 8.5 K/uL (ref 4.0–10.5)
nRBC: 0 % (ref 0.0–0.2)

## 2024-03-17 LAB — GLUCOSE, CAPILLARY: Glucose-Capillary: 134 mg/dL — ABNORMAL HIGH (ref 70–99)

## 2024-03-17 MED ORDER — POTASSIUM CHLORIDE 10 MEQ/50ML IV SOLN
10.0000 meq | INTRAVENOUS | Status: DC
  Filled 2024-03-17 (×4): qty 50

## 2024-03-17 MED ORDER — MAGNESIUM SULFATE 2 GM/50ML IV SOLN
2.0000 g | Freq: Once | INTRAVENOUS | Status: AC
  Administered 2024-03-17: 2 g via INTRAVENOUS
  Filled 2024-03-17: qty 50

## 2024-03-17 MED ORDER — SODIUM CHLORIDE 0.9% FLUSH
10.0000 mL | Freq: Two times a day (BID) | INTRAVENOUS | Status: DC
  Administered 2024-03-17 – 2024-03-19 (×5): 10 mL
  Administered 2024-03-20: 09:00:00 20 mL
  Administered 2024-03-20 – 2024-03-21 (×2): 10 mL

## 2024-03-17 MED ORDER — FUROSEMIDE 10 MG/ML IJ SOLN
20.0000 mg | Freq: Once | INTRAMUSCULAR | Status: AC
  Administered 2024-03-17: 20 mg via INTRAVENOUS
  Filled 2024-03-17: qty 2

## 2024-03-17 MED ORDER — SODIUM CHLORIDE 0.9% FLUSH: 10.0000 mL | INTRAVENOUS | Status: DC | PRN

## 2024-03-17 MED ORDER — POTASSIUM CHLORIDE 10 MEQ/100ML IV SOLN
10.0000 meq | INTRAVENOUS | Status: AC
  Administered 2024-03-17 – 2024-03-18 (×4): 10 meq via INTRAVENOUS
  Filled 2024-03-17: qty 100

## 2024-03-17 MED ORDER — FUROSEMIDE 10 MG/ML IJ SOLN
40.0000 mg | Freq: Once | INTRAMUSCULAR | Status: AC
  Administered 2024-03-17: 40 mg via INTRAVENOUS
  Filled 2024-03-17: qty 4

## 2024-03-17 MED ORDER — AMOXICILLIN-POT CLAVULANATE 875-125 MG PO TABS
1.0000 | ORAL_TABLET | Freq: Two times a day (BID) | ORAL | Status: DC
  Administered 2024-03-17 – 2024-03-18 (×4): 1 via ORAL
  Filled 2024-03-17 (×4): qty 1

## 2024-03-17 MED ORDER — POTASSIUM CHLORIDE 10 MEQ/100ML IV SOLN: 10.0000 meq | INTRAVENOUS | Status: DC

## 2024-03-17 MED ORDER — FUROSEMIDE 10 MG/ML IJ SOLN
40.0000 mg | Freq: Two times a day (BID) | INTRAMUSCULAR | Status: DC
  Administered 2024-03-17 – 2024-03-18 (×2): 40 mg via INTRAVENOUS
  Filled 2024-03-17 (×2): qty 4

## 2024-03-17 NOTE — Anesthesia Postprocedure Evaluation (Signed)
 Anesthesia Post Note  Patient: Mary Marks  Procedure(s) Performed: HEMIARTHROPLASTY (BIPOLAR) HIP, LATERAL APPROACH FOR FRACTURE (Right: Hip)  Patient location during evaluation: Phase II Anesthesia Type: Spinal Level of consciousness: awake Pain management: pain level controlled Vital Signs Assessment: post-procedure vital signs reviewed and stable Respiratory status: spontaneous breathing and respiratory function stable Cardiovascular status: blood pressure returned to baseline and stable Postop Assessment: no headache and no apparent nausea or vomiting Anesthetic complications: no Comments: Late entry   No notable events documented.   Last Vitals:  Vitals:   03/17/24 0630 03/17/24 0752  BP:    Pulse: 69   Resp: 16   Temp:  37.1 C  SpO2: 93%     Last Pain:  Vitals:   03/17/24 0757  TempSrc:   PainSc: 2                  Yvonna JINNY Bosworth

## 2024-03-17 NOTE — Progress Notes (Signed)
 PICC line placed, coox ok 65%. Repeating for tomorrow AM. CVP pending. Lactic acid was normal. Would look to gently diurese over the weekend. If recurrent issues with hypotension would reach out to weekend heart failure attending.    Dorn Ross MD

## 2024-03-17 NOTE — Progress Notes (Signed)
 [facility] PROGRESS NOTE  Mary Marks (DOB: [birth year]) FMW:[id number] PCP: [name] [name], FNP  Brief Narrative: Adriann Thau is a 88 y.o. female with a history of OA, HTN, HLD, hypothyroidism who presented to the ED on 03/13/2024 after a fall at home, found to have a displaced right femoral neck fracture. Also hypoxemic with workup suggesting aspiration pneumonia, cardiomegaly, dilated pulmonary artery by CTA. Echocardiogram shows right heart dysfunction and severe pulmonary hypertension. Patient is seen by cardiology, plan for PICC line, CVP monitoring.  Probably need to be transferred to [facility].  But will wait for PICC line first  Subjective: Patient seen and examined at the bedside earlier today.  Reports some shortness of breath, blood pressure has been stable.  Chest x-ray was obtained today shows mild edema.  Objective: BP 132/76   Pulse 85   Temp 98.8 F (37.1 C) (Axillary)   Resp 16   Ht 5' (1.524 m)   Wt 87.4 kg   SpO2 91%   BMI 37.63 kg/m   Gen: Elderly female in no distress Pulm: Clear, nonlabored  CV: Regular, no murmur  GI: Soft, NT, ND, +BS Neuro: Alert and oriented. No new focal deficits. Ext: Warm, dry, trace edema bilaterally, thigh dressing intact and clean Skin: No new rashes, lesions or ulcers on visualized skin   Assessment & Plan: Right femoral neck fracture s/p fall at home:  - S/p repair 12/9 per Dr. Margrette. - Weight bearing recommendations per orthopedics.  - VTE ppx Lovenox  - Pain control recommendations per orthopedics - PT/OT evaluations pending.   Acute hypoxic respiratory failure: No hx tobacco use, COPD, inhalers. Has a recent cough but clear CXR. No pleuritic chest pain or known DVT/PE risk factors, CTA negative for PE - CTA chest >> simple aspiration, treated with IV Unasyn , switch to Augmentin today - Dilated pulmonary artery and contrast reflux, with cardiomegaly, concern for right-sided heart failure, echocardiogram with  significant right ventricular dysfunction, pulmonary hypertension.  RV failure Severe pulmonary hypertension Hypotension: Hypotension 12/10 has resolved - Hypotension and lactic acidosis was secondary to hypovolemia. - This has improved with IV fluids. - Mild pulmonary edema today, Lasix  20 mg given. - Cardiology consulted to severe RV dysfunction. - PICC line ordered for CVP monitoring, Coox, patient will likely benefit from evaluation by heart failure team.   hypothyroidism: TSH 3.600.   - Continue synthroid    Anxiety, depression: - Sertraline   - Continue alprazolam  0.5mg  TID prn   HTN:  - Sinus bradycardia noted with 1st deg AVB.  Will avoid beta-blocker. Will discontinue lisinopril  for now due to hypotension   HLD:  - Atorvastatin  20mg , note pravastatin allergy .   Neuropathy, osteoarthritis:  - Tylenol  prn mild pain, continue home lyrica  200mg  BID   Thrombocytopenia:  - Mild, stable. Pending ortho recommendations, will continue heparin  for VTE ppx.    DNR:   Discussion: Pending PT/OT evaluation.  Likely will need rehab  Nissan Frazzini, MD [facility] [email or web address] 03/17/2024, 3:00 PM

## 2024-03-17 NOTE — Progress Notes (Addendum)
 Overnight   NAME: Mary Marks MRN: [id number] DOB : Jan 08, 1936    Date of Service   03/17/2024   HPI/Events of Note   HPI: 88 year old female with history of OA, HTN, HLD, hypothyroidism who presented to the emergency department on 03/13/2024 after a fall at home, found to have a displaced right femoral neck fracture also hypoxic with workup suggesting aspiration pneumonia, cardiomegaly, dilated pulmonary artery by CTA.  Echocardiogram indicative of right heart dysfunction and severe pulmonary hypertension.  PICC line inserted on 12/12 ,CVP monitoring started 12/12, with a tentative plan to transfer to [facility] post PICC line insertion.   Overnight: RN reported @2108  patient is more lethargic, responding to voice but falls back to sleep quickly.  Oriented to person and situation but not time and place. RN also reports decrease strength.  Denies sensation loss.  Blood sugar was obtained which was 134. vital signs stable.  Patient received 40 mg of IV Lasix  at 1937.  Stat CBC, and VBG ordered.  Physical Exam Eyes:     Pupils: Pupils are equal, round, and reactive to light.  Cardiovascular:     Rate and Rhythm: Normal rate and regular rhythm.     Pulses:          Radial pulses are 2+ on the right side and 2+ on the left side.       Posterior tibial pulses are 2+ on the right side and 2+ on the left side.  Pulmonary:     Breath sounds: Decreased air movement present.  Musculoskeletal:     Right upper arm: Swelling present.     Left upper arm: Swelling present.  Skin:    General: Skin is warm and dry.     Capillary Refill: Capillary refill takes 2 to 3 seconds.  Neurological:     Mental Status: She is lethargic and disoriented.     GCS: GCS eye subscore is 4. GCS verbal subscore is 5. GCS motor subscore is 6.     Sensory: Sensation is intact.     Motor: Weakness present.  Psychiatric:        Behavior: Behavior is slowed.      Interventions/ Plan   CBC- replace electrolytes as  needed. Hypokalemia 3.0, hypomagnesia of 1.7. Ordered 40 meq of K, PO intake poor at this time due to lethargy. Attempted to order central line dose to decrease fluid intake but pharmacy not available to mix at night.  2 grams of mag IV ordered.  VBG- 2150: VBG with metabolic alkalosis pH of 7.49 bicarb 42.7.  likely due to diureses. suggest holding lasix , decreasing dose or using diamox  to morning team Continue neuro-assessments q4 and update provider of any changes       CRITICAL CARE Performed by: Brailee Riede M Donati-Garmon   Total critical care time: 30 minutes  Critical care time was exclusive of separately billable procedures and treating other patients.  Critical care was necessary to treat or prevent imminent or life-threatening deterioration.  Critical care was time spent personally by me on the following activities: development of treatment plan with patient and/or surrogate as well as nursing, discussions with consultants, evaluation of patient's response to treatment, examination of patient, obtaining history from patient or surrogate, ordering and performing treatments and interventions, ordering and review of laboratory studies, ordering and review of radiographic studies, pulse oximetry and re-evaluation of patient's condition.    Leolia Vinzant Donati- Aram BSN RN CCRN AGACNP-BC Acute Care Nurse Practitioner  [facility] [place]

## 2024-03-17 NOTE — Care Management Important Message (Signed)
 Important Message  Patient Details  Name: Mary Marks MRN: [id number] Date of Birth: 06-18-1935   Important Message Given:  Yes - Medicare IM     [name] [name] 03/17/2024, 11:23 AM

## 2024-03-17 NOTE — Plan of Care (Signed)

## 2024-03-17 NOTE — Progress Notes (Addendum)
 Peripherally Inserted Central Catheter Placement  The IV Nurse has discussed with the patient and/or persons authorized to consent for the patient, the purpose of this procedure and the potential benefits and risks involved with this procedure.  The benefits include less needle sticks, lab draws from the catheter, and the patient may be discharged home with the catheter. Risks include, but not limited to, infection, bleeding, blood clot (thrombus formation), and puncture of an artery; nerve damage and irregular heartbeat and possibility to perform a PICC exchange if needed/ordered by physician.  Alternatives to this procedure were also discussed.  Bard Power PICC patient education guide, fact sheet on infection prevention and patient information card has been provided to patient /or left at bedside. Patient confused. Daughter, Bascom, gave verbal consent over phone with RN x 2 witness.     PICC Placement Documentation  PICC Double Lumen 03/17/24 Right Brachial 40 cm 0 cm (Active)  Indication for Insertion or Continuance of Line Chronic illness with exacerbations (CF, Sickle Cell, etc.) 03/17/24 1520  Exposed Catheter (cm) 0 cm 03/17/24 1520  Site Assessment Clean, Dry, Intact 03/17/24 1520  Lumen #1 Status Flushed;Saline locked;Blood return noted 03/17/24 1520  Lumen #2 Status Flushed;Saline locked;Blood return noted 03/17/24 1520  Dressing Type Transparent 03/17/24 1520  Dressing Status Antimicrobial disc/dressing in place 03/17/24 1520  Line Care Lumen 1 cap changed;Lumen 2 cap changed;Connections checked and tightened 03/17/24 1520  Dressing Change Due 03/24/24 03/17/24 1520       Lenny Fiumara CHRISTELLA Bohr 03/17/2024, 3:44 PM

## 2024-03-17 NOTE — Plan of Care (Signed)
°  Problem: Education: Goal: Knowledge of General Education information will improve Description: Including pain rating scale, medication(s)/side effects and non-pharmacologic comfort measures Outcome: Progressing   Problem: Health Behavior/Discharge Planning: Goal: Ability to manage health-related needs will improve Outcome: Progressing   Problem: Clinical Measurements: Goal: Ability to maintain clinical measurements within normal limits will improve Outcome: Progressing Goal: Will remain free from infection Outcome: Progressing Goal: Diagnostic test results will improve Outcome: Progressing Goal: Respiratory complications will improve Outcome: Progressing Goal: Cardiovascular complication will be avoided Outcome: Progressing   Problem: Activity: Goal: Risk for activity intolerance will decrease Outcome: Progressing   Problem: Nutrition: Goal: Adequate nutrition will be maintained Outcome: Progressing   Problem: Coping: Goal: Level of anxiety will decrease Outcome: Not Applicable   Problem: Elimination: Goal: Will not experience complications related to bowel motility Outcome: Progressing Goal: Will not experience complications related to urinary retention Outcome: Not Applicable   Problem: Safety: Goal: Ability to remain free from injury will improve Outcome: Adequate for Discharge   Problem: Skin Integrity: Goal: Risk for impaired skin integrity will decrease Outcome: Progressing   Problem: Education: Goal: Verbalization of understanding the information provided (i.e., activity precautions, restrictions, etc) will improve Outcome: Progressing   Problem: Activity: Goal: Ability to ambulate and perform ADLs will improve Outcome: Progressing   Problem: Clinical Measurements: Goal: Postoperative complications will be avoided or minimized Outcome: Progressing   Problem: Self-Concept: Goal: Ability to maintain and perform role responsibilities to the fullest  extent possible will improve Outcome: Progressing   Problem: Pain Management: Goal: Pain level will decrease Outcome: Progressing

## 2024-03-17 NOTE — Consult Note (Addendum)
 Cardiology Consultation   Patient ID: Mary Marks MRN: [id number]; DOB: 1935-04-13  Admit date: 03/13/2024 Date of Consult: 03/17/2024  PCP:  [name] [name], FNP   [facility] Providers Cardiologist: [facility] - Dr. Alvan  Patient Profile: Mary Marks is a 88 y.o. female with a hx of HTN, HLD, hypothyroidism and OA who is being seen 03/17/2024 for the evaluation of RV failure at the request of Dr. Mcarthur.  History of Present Illness: [name] presented to [facility] on 03/13/2024 after suffering a mechanical fall at home and family found her on the ground the following day. She was found to have a displaced right femoral neck fracture and underwent bipolar replacement of her right hip by Dr. Margrette on 03/14/2024.  On admission, she was found to be hypoxic and CXR showed moderate cardiomegaly. CTA showed no evidence of a PE but was noted to have an irregular consolidation within the dependent right upper lobe suspicious for aspiration and possible small airway disease given diffuse mosaic attenuation. Main pulmonary artery was also dilated. proBNP was elevated to 8312. She was started on IV Unasyn  given concerns for aspiration and echocardiogram was obtained on 03/15/2024 which showed a preserved EF of 60 to 65% with moderate LVH, moderately to severely reduced RV function and severely elevated PASP. Also had moderate TR.   She did have hypotension in the postoperative setting along with lactic acidosis (LA up to 3.3) and hypotension resolved and LA normalized with administration of IV fluids. She received 20 mg of IV Lasix  today which is her first dose of diuretics this admission. Listed as being a net +3.5 L this admission. Weight was listed at 175 lbs on admission and recorded at 192 lbs on 03/15/2024.  In talking with the patient today, she awakens but quickly falls back asleep. She has been receiving pain medication and says that she did not sleep well overnight  given hip pain. Reports having chronic shortness of breath for several years and this is typically most notable with activity. She lives by herself and is able to perform ADL's independently and goes to the grocery store per her report.  Denies any associated chest pain or palpitations. No specific orthopnea, PND or pitting edema.  Unsure of her baseline weight but by review of Care Everywhere, this was at 179 lbs when checked by her PCP in 02/2024. Appears that she was actually having some lower extremity edema at that time and was started on Lasix  20 mg daily. She is unaware of any known history of CAD or CHF. Has never undergone a sleep study for OSA.  Past Medical History:  Diagnosis Date   Anxiety    Arthritis    in knees   Basal cell carcinoma 07/26/2019   nodular on right lower back - CX3+5FU   Cancer (HCC)    Melanoma on back   Depression    Dyspnea    Hyperlipidemia    Hypertension    Hypothyroidism    Melanoma (HCC)    left post shoulder   Squamous cell carcinoma of skin 03/20/1999   well diff-lower bulb nose  (txpb)   Thyroid  disease    Hypothyroid    Past Surgical History:  Procedure Laterality Date   DILATION AND CURETTAGE OF UTERUS     EXCISION OF BACK LESION     Melanoma   HIP ARTHROPLASTY Right 03/14/2024   Procedure: HEMIARTHROPLASTY (BIPOLAR) HIP, LATERAL APPROACH FOR FRACTURE;  Surgeon: Margrette Taft BRAVO,  MD;  Location: [facility];  Service: Orthopedics;  Laterality: Right;   LARYNGOPLASTY Left 08/08/2021   Procedure: LEFT MEDIALIZATION LARYNGOPLASTY;  Surgeon: Carlie Clark, MD;  Location: [facility] OR;  Service: ENT;  Laterality: Left;  RNFA   TUBAL LIGATION  1980     Home Medications:  Prior to Admission medications  Medication Sig Start Date End Date Taking? Authorizing Provider  ALPRAZolam  (XANAX ) 0.5 MG tablet Take 1 tablet (0.5 mg total) by mouth 2 (two) times daily as needed for anxiety. Patient taking differently: Take 0.5 mg by mouth 3 (three) times daily.  09/08/19  Yes Hawks, Christy A, FNP  Aspirin-Caffeine (BC FAST PAIN RELIEF PO) Take 1 Package by mouth 2 (two) times daily as needed (pain).   Yes [provider]  atorvastatin  (LIPITOR) 20 MG tablet Take 1 tablet (20 mg total) by mouth daily. 06/29/19  Yes Hawks, Christy A, FNP  cetirizine (ZYRTEC) 10 MG tablet Take 10 mg by mouth daily. 10/25/19  Yes [provider]  cholecalciferol  (VITAMIN D3) 25 MCG (1000 UNIT) tablet Take 1,000 Units by mouth daily.   Yes [provider]  fluticasone  (CUTIVATE ) 0.05 % cream Apply 1 Application topically daily as needed (psoriasis). 03/01/24  Yes [provider]  furosemide  (LASIX ) 20 MG tablet TAKE 1 TABLET EVERY DAY Patient taking differently: Take 20 mg by mouth daily as needed for edema. 11/06/19  Yes Hawks, Christy A, FNP  hydrocortisone cream 1 % Apply 1 application. topically daily as needed for itching (psoriasis).   Yes [provider]  levothyroxine  (SYNTHROID ) 112 MCG tablet Take 112 mcg by mouth every morning. 07/14/21  Yes [provider]  lisinopril  (ZESTRIL ) 40 MG tablet Take 40 mg by mouth daily. 02/09/20  Yes [provider]  metoprolol  succinate (TOPROL -XL) 100 MG 24 hr tablet Take 50 mg by mouth daily.   Yes [provider]  pregabalin  (LYRICA ) 200 MG capsule Take 1 capsule (200 mg total) by mouth 2 (two) times daily. 06/29/19  Yes Hawks, Christy A, FNP  sertraline  (ZOLOFT ) 100 MG tablet Take 1 tablet (100 mg total) by mouth daily. 06/29/19  Yes Hawks, Bari A, FNP    Scheduled Meds:  amoxicillin-clavulanate  1 tablet Oral Q12H   atorvastatin   20 mg Oral Daily   Chlorhexidine  Gluconate Cloth  6 each Topical Daily   cholecalciferol   1,000 Units Oral Daily   docusate sodium   100 mg Oral BID   enoxaparin  (LOVENOX ) injection  40 mg Subcutaneous Q24H   levothyroxine   112 mcg Oral q morning   midodrine   5 mg Oral TID WC   pantoprazole   40 mg Oral Daily   pregabalin   200 mg  Oral BID   sertraline   100 mg Oral Daily   sodium chloride  flush  3 mL Intravenous Q12H   traMADol   50 mg Oral Q6H   Continuous Infusions:  PRN Meds: acetaminophen , ALPRAZolam , alum & mag hydroxide-simeth, bisacodyl , HYDROcodone -acetaminophen , HYDROcodone -acetaminophen , Influenza vac split trivalent PF, methocarbamol  **OR** methocarbamol  (ROBAXIN ) injection, metoCLOPramide  **OR** metoCLOPramide  (REGLAN ) injection, morphine  injection, ondansetron  **OR** ondansetron  (ZOFRAN ) IV, mouth rinse, polyethylene glycol  Allergies:   Allergies[1]  Social History:   Social History   Socioeconomic History   Marital status: Widowed    Spouse name: Not on file   Number of children: 2   Years of education: Not on file   Highest education level: Not on file  Occupational History   Not on file  Tobacco Use   Smoking status: Never  Smokeless tobacco: Never  Vaping Use   Vaping status: Never Used  Substance and Sexual Activity   Alcohol use: Never   Drug use: Never   Sexual activity: Not on file  Other Topics Concern   Not on file  Social History Narrative   Not on file    Family History:   History reviewed. No pertinent family history.   ROS:  Please see the history of present illness.   All other ROS reviewed and negative.     Physical Exam/Data: Vitals:   03/17/24 0530 03/17/24 0600 03/17/24 0630 03/17/24 0752  BP:  116/74    Pulse: 78 69 69   Resp: 16 18 16    Temp:    98.8 F (37.1 C)  TempSrc:    Axillary  SpO2: 94% 93% 93%   Weight:      Height:        Intake/Output Summary (Last 24 hours) at 03/17/2024 1109 Last data filed at 03/17/2024 9361 Gross per 24 hour  Intake 490.66 ml  Output 260 ml  Net 230.66 ml      03/15/2024   12:17 PM 03/14/2024    9:38 AM 03/13/2024   11:50 AM  Last 3 Weights  Weight (lbs) 192 lb 10.9 oz 175 lb 175 lb  Weight (kg) 87.4 kg 79.379 kg 79.379 kg     Body mass index is 37.63 kg/m.  General: Pleasant, elderly female appearing  in no acute distress HEENT: normal Neck: JVD appears elevated at 13 to 15 cm. Vascular: No carotid bruits; Distal pulses 2+ bilaterally Cardiac:  normal S1, S2; RRR; no murmur  Lungs: Decreased breath sounds along bases but evaluation limited as patient unable to lean forward. Abd: soft, nontender, no hepatomegaly  Ext: Trace lower extremity edema bilaterally.   Musculoskeletal:  No deformities, BUE and BLE strength normal and equal Skin: warm and dry  Neuro:  CNs 2-12 intact, no focal abnormalities noted Psych:  Normal affect   EKG:  The EKG was personally reviewed and demonstrates: Normal sinus rhythm, heart rate 65 with first-degree AV block and T wave inversion along leads III, aVF, V3 and V4.  Telemetry:  Telemetry was personally reviewed and demonstrates: Normal sinus rhythm, heart in 70's to 80's with PAC's.  Relevant CV Studies:  Echocardiogram: 03/2024 IMPRESSIONS     1. Left ventricular ejection fraction, by estimation, is 60 to 65%. The  left ventricle has normal function. The left ventricle has no regional  wall motion abnormalities. There is moderate left ventricular hypertrophy.  Left ventricular diastolic  parameters are indeterminate. There is the interventricular septum is  flattened in systole and diastole, consistent with right ventricular  pressure and volume overload. The global longitudinal strain is abnormal.   2. Right ventricular systolic function moderately to severely reduced.  The right ventricular size is severely enlarged. There is severely  elevated pulmonary artery systolic pressure.   3. The mitral valve is normal in structure. No evidence of mitral valve  regurgitation. No evidence of mitral stenosis.   4. The tricuspid valve is abnormal. Tricuspid valve regurgitation is  moderate.   5. The aortic valve is tricuspid. There is mild calcification of the  aortic valve. There is mild thickening of the aortic valve. Aortic valve  regurgitation is  not visualized. No aortic stenosis is present.   6. The inferior vena cava is dilated in size with <50% respiratory  variability, suggesting right atrial pressure of 15 mmHg.    Laboratory Data: High  Sensitivity Troponin:  No results for input(s): TROPONINIHS in the last 720 hours. No results for input(s): TRNPT in the last 720 hours.    Chemistry Recent Labs  Lab 03/15/24 1047 03/16/24 0502 03/17/24 0607  NA 141 142 141  K 4.1 3.6 3.7  CL 108 107 106  CO2 20* 28 23  GLUCOSE 159* 82 90  BUN 17 19 15   CREATININE 0.84 0.83 0.58  CALCIUM  8.0* 8.5* 8.3*  GFRNONAA >60 >60 >60  ANIONGAP 13 7 13     No results for input(s): PROT, ALBUMIN, AST, ALT, ALKPHOS, BILITOT in the last 168 hours. Lipids No results for input(s): CHOL, TRIG, HDL, LABVLDL, LDLCALC, CHOLHDL in the last 168 hours.  Hematology Recent Labs  Lab 03/15/24 0447 03/15/24 1047 03/16/24 0502 03/17/24 0607  WBC 12.3*  --  10.1 8.5  RBC 4.38  --  4.21 3.92  HGB 12.7 12.4 11.8* 11.2*  HCT 39.6 40.1 38.2 35.7*  MCV 90.4  --  90.7 91.1  MCH 29.0  --  28.0 28.6  MCHC 32.1  --  30.9 31.4  RDW 14.6  --  14.9 15.0  PLT 138*  --  143* 138*   Thyroid   Recent Labs  Lab 03/13/24 1542  TSH 3.600    BNP Recent Labs  Lab 03/13/24 1542  PROBNP 8,312.0*    DDimer No results for input(s): DDIMER in the last 168 hours.  Radiology/Studies:  DG CHEST PORT 1 VIEW Result Date: 03/16/2024 CLINICAL DATA:  Shortness of breath. EXAM: PORTABLE CHEST 1 VIEW COMPARISON:  Chest radiograph dated 03/13/2024. FINDINGS: There is cardiomegaly with vascular congestion. Small bilateral pleural effusions and bibasilar atelectasis or infiltrate. No pneumothorax. No acute osseous pathology. IMPRESSION: Cardiomegaly with vascular congestion and small bilateral pleural effusions. Electronically Signed   By: Vanetta Chou M.D.   On: 03/16/2024 18:12    DG HIP UNILAT WITH PELVIS 2-3 VIEWS RIGHT Result Date:  03/14/2024 CLINICAL DATA:  Postop. EXAM: DG HIP (WITH OR WITHOUT PELVIS) 2-3V RIGHT COMPARISON:  Preoperative imaging FINDINGS: Right hip arthroplasty in expected alignment. No periprosthetic lucency or fracture. Recent postsurgical change includes air and edema in the soft tissues. Overlying skin staples in place. IMPRESSION: Right hip arthroplasty without immediate postoperative complication. Electronically Signed   By: Andrea Gasman M.D.   On: 03/14/2024 15:44   CT Angio Chest PE W/Cm &/Or Wo Cm Result Date: 03/13/2024 CLINICAL DATA:  Status post fall.  Hypoxia. EXAM: CT ANGIOGRAPHY CHEST WITH CONTRAST TECHNIQUE: Multidetector CT imaging of the chest was performed using the standard protocol during bolus administration of intravenous contrast. Multiplanar CT image reconstructions and MIPs were obtained to evaluate the vascular anatomy. RADIATION DOSE REDUCTION: This exam was performed according to the departmental dose-optimization program which includes automated exposure control, adjustment of the mA and/or kV according to patient size and/or use of iterative reconstruction technique. CONTRAST:  75mL OMNIPAQUE  IOHEXOL  350 MG/ML SOLN COMPARISON:  CT chest dated 06/19/2021 FINDINGS: Cardiovascular: The study is high quality for the evaluation of pulmonary embolism. There are no filling defects in the central, lobar, segmental or subsegmental pulmonary artery branches to suggest acute pulmonary embolism. Heterogeneous enhancement of a right middle lobe segmental pulmonary artery (5:56 in an area of motion artifact. Great vessels are normal in course and caliber. Dilated main pulmonary artery measures 3.6 cm. Multichamber cardiomegaly. No significant pericardial fluid/thickening. Coronary artery calcifications and aortic atherosclerosis. Reflux of contrast material into the hepatic veins, suggesting a degree of right heart dysfunction. Mediastinum/Nodes: Imaged  thyroid  gland without nodules meeting criteria  for imaging follow-up by size. Normal esophagus. 11 mm precarinal lymph node (5:40), previously 9 mm, and 14 mm subcarinal lymph node (5:58), previously 10 mm. Lungs/Pleura: The central airways are patent. Adherent secretions within the trachea. Irregular consolidation within the dependent right upper lobe. Diffuse mosaic attenuation. Mild diffuse bronchial wall thickening with subsegmental mucous plugging. No pneumothorax. No pleural effusion. Upper abdomen: Normal. Musculoskeletal: No acute or abnormal lytic or blastic osseous lesions. Partially imaged inferior endplate compression of T12. Review of the MIP images confirms the above findings. IMPRESSION: 1. No evidence of acute pulmonary embolism. Heterogeneous enhancement of a right middle lobe segmental pulmonary artery in an area of motion artifact is favored to be artifactual. 2. Irregular consolidation within the dependent right upper lobe, suspicious for aspiration. 3. Diffuse mosaic attenuation, which can be seen in the setting of small airways disease. 4. Mildly enlarged mediastinal lymph nodes, likely reactive. 5. Dilated main pulmonary artery, which can be seen in the setting of pulmonary arterial hypertension. 6. Multichamber cardiomegaly with reflux of contrast material into the hepatic veins, suggesting a degree of right heart dysfunction. 7.  Aortic Atherosclerosis (ICD10-I70.0). Electronically Signed   By: Limin  Xu M.D.   On: 03/13/2024 15:25   CT Head Wo Contrast Result Date: 03/13/2024 EXAM: CT HEAD WITHOUT CONTRAST 03/13/2024 02:45:58 PM TECHNIQUE: CT of the head was performed without the administration of intravenous contrast. Automated exposure control, iterative reconstruction, and/or weight based adjustment of the mA/kV was utilized to reduce the radiation dose to as low as reasonably achievable. COMPARISON: None available. CLINICAL HISTORY: Head trauma, minor (Age >= 65y). FINDINGS: BRAIN AND VENTRICLES: No acute hemorrhage. No evidence  of acute infarct. No hydrocephalus. No extra-axial collection. No mass effect or midline shift. Age-related cerebral atrophy. Periventricular white matter changes, likely sequela of chronic small vessel ischemic disease. Remote left cerebellar infarct. Remote lacunar infarcts in the bilateral basal ganglia. Atherosclerotic calcifications in intracranial carotid and vertebral arteries. ORBITS: Bilateral lens replacements. SINUSES: Near complete opacification of right maxillary sinus with osseous thickening, likely chronic sinusitis. Mucosal thickening in ethmoid air cells. SOFT TISSUES AND SKULL: No acute soft tissue abnormality. No skull fracture. IMPRESSION: 1. No acute intracranial abnormality. 2. Age-related cerebral atrophy, chronic small vessel ischemic disease, and remote left cerebellar infarct. 3. Near complete opacification of the right maxillary sinus with osseous thickening, likely chronic sinusitis, with additional mucosal thickening in the ethmoid air cells. Electronically signed by: Donnice Mania MD 03/13/2024 03:08 PM EST RP Workstation: [id number]   DG Chest 1 View Result Date: 03/13/2024 CLINICAL DATA:  Right hip fracture.  Pre-op clearance exam EXAM: CHEST  1 VIEW COMPARISON:  03/08/2022 FINDINGS: Moderate cardiomegaly again noted as well as ectasia of the thoracic aorta. Both lungs are clear. Several old right rib fracture deformities are noted. IMPRESSION: Moderate cardiomegaly. No active lung disease. Electronically Signed   By: Norleen DELENA Kil M.D.   On: 03/13/2024 13:15   DG Hip Unilat With Pelvis 2-3 Views Right Result Date: 03/13/2024 CLINICAL DATA:  Fall.  Right hip pain. EXAM: DG HIP (WITH OR WITHOUT PELVIS) 3V RIGHT COMPARISON:  None Available. FINDINGS: Displaced and impacted right femoral neck fracture is seen. No dislocation. Osteopenia noted. Degenerative changes are seen involving the pubic symphysis and lower lumbar spine. IMPRESSION: Displaced and impacted right femoral neck  fracture. Electronically Signed   By: Norleen DELENA Kil M.D.   On: 03/13/2024 13:14     Assessment and Plan:  1. RV  Dysfunction/Pulmonary HTN - Echocardiogram this admission shows a preserved EF as outlined above but she does have moderately to severely reduced RV function and RVSP significantly elevated at 61. CTA was negative for PE on admission but there was concern for small airway disease and possible aspiration. Suspect group 3 pulmonary hypertension given no known history of prior PE or LHD. - She received IV fluids in the postoperative setting given hypotension and is now volume overloaded. Appears her baseline weight is around 175 lbs and listed as 192 lbs on most recent check. Would recommend following daily weights. She has been started on IV Lasix  20mg  this morning and pending response, might need to titrate to 40mg  daily. Want to be cautious not to over diurese given reduced RV function. Pending clinical course, can consider workup of her RV dysfunction as an outpatient with possible sleep study and RHC but this will depend on how aggressive the patient and her family want to be as she is 88 yo and currently DNR.  2.  Right femoral neck fracture - She underwent bipolar replacement of her right hip on 03/14/2024.  Management per orthopedics.  3.  Acute hypoxic respiratory failure - Likely multifactorial in the setting of possible aspiration and CHF exacerbation. She remains on Augmentin for aspiration and will plan to diurese as discussed above.  For questions or updates, please contact [facility] Please consult [email or web address] for contact info under   Signed, Laymon CHRISTELLA Qua, PA-C  03/17/2024 11:09 AM  Attending Note  Patient seen and discussed with PA Qua, I agree with her documentation. 88 yo female history of HTN, HLD, hypothyroidism, initially admitted 03/13/24 with fall. Developed right femoral neck fracture, s/p surgery 03/14/24.  On admission note to be hypoxic,  being managed for aspiration pneumonia. S/p hip surgery, has ongoing issues with hypoxia. From notes issues with hypotension and lactic acidosis that resolved with IVFs, currently signs of fluid overload. Echo during admission showed significant RV dysfunction. .    Admit labs K 3.8 BUN 17 Cr 0.69 WBC 11.5 Hgb 14.7 Plt 145 proBNP 8312 TSH 3.6 Lactic acid 3.3  EKG SR, nonspecific ST/T changes CXR: no acute process CT PE: no PE. RUL consolidation possible aspiration, pattern suggestive small airway disease, mild pulm artery, Multichamber cardiomegaly with reflux of contrast material into the hepatic veins, suggesting a degree of right heart dysfunction. 12/11 CXR: Cardiomegaly with vascular congestion and small bilateral pleural effusions  03/2024 echo: LVE 60-65%, no WMAs, mod LVH, indet diastolic function, D shaped septum, mod to severe RV dysfunction, severe RVE, dilated fixed IVC, normal LA, PASP 61    1. RV dysfunction/pulmonary HTN/HFpEF - 03/2024 echo: LVE 60-65%, no WMAs, mod LVH, indet diastolic function, D shaped septum, mod to severe RV dysfunction, severe RVE, dilated fixed IVC, normal LA, PASP 61 - - CXR vascular congestion, proBNP 8312 - chronicity of RV dysfunction is unclear, no prior echos. Presented hypoxic sats in the 80s, diagnosed with aspiration pneumonia - CT PE negative for acute PE - no smoking history - no autoimmune history - BMI 37, risk for OSA and OHS - normal LA, would argue against long standing elevated left sided pressures  - prior low bp's and lactic acidosis concerning if perhaps related to her RV failure. Managed by primary team. From notes resolved with IV fluids, she is also on midodrine .. She is volume overloaded currently and will require diuresis. Perhaps may run into recurrent issues with bp's - discuss PICC line with primary team  for CVP and coox monitoring, I think best option would be transfer to [facility] for HF team evaluation.  - start IV  lasix  40mg  bid  4.Aspiration pneumonia - on IV abx per primary team   5. Hypotension - transient hypotension, had resolved with IVFs manged by primary team - now on midodrine . MAPs 80s - unclear if related to RV failure, place PICC line and monitor cvp and coox with plans for diuresis  Dorn Ross MD     [1]  Allergies Allergen Reactions   Pravastatin Dermatitis and Rash

## 2024-03-17 NOTE — TOC Progression Note (Signed)
 Transition of Care [facility]) - Progression Note    Patient Details  Name: Mary Marks MRN: [id number] Date of Birth: [birth year]  [facility] CM/SW Contact  Hoy DELENA Bigness, LCSW Phone Number: 03/17/2024, 9:35 AM  Clinical Narrative:    PASRR returned: [id number].                     Expected Discharge Plan and Services                                               Social Drivers of Health (SDOH) Interventions SDOH Screenings   Food Insecurity: No Food Insecurity (03/13/2024)  Housing: Low Risk (03/13/2024)  Transportation Needs: No Transportation Needs (03/13/2024)  Utilities: Not At Risk (03/13/2024)  Social Connections: Moderately Isolated (03/13/2024)  Tobacco Use: Low Risk (03/14/2024)    Readmission Risk Interventions    03/16/2024    2:38 PM 03/14/2024    9:56 AM  Readmission Risk Prevention Plan  Post Dischage Appt Complete Complete  Medication Screening Complete Complete  Transportation Screening Complete Complete

## 2024-03-18 LAB — BASIC METABOLIC PANEL WITH GFR
Anion gap: 4 — ABNORMAL LOW (ref 5–15)
BUN: 13 mg/dL (ref 8–23)
CO2: 40 mmol/L — ABNORMAL HIGH (ref 22–32)
Calcium: 8.2 mg/dL — ABNORMAL LOW (ref 8.9–10.3)
Chloride: 98 mmol/L (ref 98–111)
Creatinine, Ser: 0.56 mg/dL (ref 0.44–1.00)
GFR, Estimated: >60 mL/min (ref >60)
Glucose, Bld: 97 mg/dL (ref 70–99)
Potassium: 3.4 mmol/L — ABNORMAL LOW (ref 3.5–5.1)
Sodium: 142 mmol/L (ref 135–145)

## 2024-03-18 LAB — CBC WITH DIFFERENTIAL/PLATELET
Abs Immature Granulocytes: 0.14 K/uL — ABNORMAL HIGH (ref 0.00–0.07)
Basophils Absolute: 0 K/uL (ref 0.0–0.1)
Basophils Relative: 0 %
Eosinophils Absolute: 0.1 K/uL (ref 0.0–0.5)
Eosinophils Relative: 1 %
HCT: 37 % (ref 36.0–46.0)
Hemoglobin: 11.8 g/dL — ABNORMAL LOW (ref 12.0–15.0)
Immature Granulocytes: 1 %
Lymphocytes Relative: 13 %
Lymphs Abs: 1.4 K/uL (ref 0.7–4.0)
MCH: 28.6 pg (ref 26.0–34.0)
MCHC: 31.9 g/dL (ref 30.0–36.0)
MCV: 89.8 fL (ref 80.0–100.0)
Monocytes Absolute: 1.3 K/uL — ABNORMAL HIGH (ref 0.1–1.0)
Monocytes Relative: 12 %
Neutro Abs: 7.6 K/uL (ref 1.7–7.7)
Neutrophils Relative %: 73 %
Platelets: 151 K/uL (ref 150–400)
RBC: 4.12 MIL/uL (ref 3.87–5.11)
RDW: 14.6 % (ref 11.5–15.5)
WBC: 10.5 K/uL (ref 4.0–10.5)
nRBC: 0 % (ref 0.0–0.2)

## 2024-03-18 LAB — COOXEMETRY PANEL
Carboxyhemoglobin: 3 % — ABNORMAL HIGH (ref 0.5–1.5)
Methemoglobin: <0.7 % (ref 0.0–1.5)
O2 Saturation: 68.8 %
Total hemoglobin: 13.5 g/dL (ref 12.0–16.0)

## 2024-03-18 MED ORDER — FUROSEMIDE 10 MG/ML IJ SOLN
40.0000 mg | Freq: Every day | INTRAMUSCULAR | Status: DC
  Administered 2024-03-19: 40 mg via INTRAVENOUS
  Filled 2024-03-18: qty 4

## 2024-03-18 NOTE — Progress Notes (Signed)
 [facility] PROGRESS NOTE  Mary Marks (DOB: [birth year]) FMW:[id number] PCP: [name] [name], FNP  Brief Narrative: Mary Marks is a 88 y.o. female with a history of OA, HTN, HLD, hypothyroidism who presented to the ED on 03/13/2024 after a fall at home, found to have a displaced right femoral neck fracture. Also hypoxemic with workup suggesting aspiration pneumonia, cardiomegaly, dilated pulmonary artery by CTA. Echocardiogram shows right heart dysfunction and severe pulmonary hypertension. Patient is seen by cardiology, plan for PICC line, CVP monitoring, CoOx  Subjective: Patient seen and examined at the bedside earlier today.  Reports feeling okay.  She was somewhat fatigued and confused earlier in the morning.  Blood pressure has been as high as 170s today.  Has been diuresing well with IV Lasix .  Objective: BP 134/71   Pulse 92   Temp 98.9 F (37.2 C) (Oral)   Resp 17   Ht 5' (1.524 m)   Wt 87.4 kg   SpO2 100%   BMI 37.63 kg/m   Gen: Elderly female in no distress Pulm: Clear, nonlabored  CV: Regular, no murmur  GI: Soft, NT, ND, +BS Neuro: Alert and oriented. No new focal deficits. Ext: Warm, dry, trace edema bilaterally, thigh dressing intact and clean Skin: No new rashes, lesions or ulcers on visualized skin   Assessment & Plan: Right femoral neck fracture s/p fall at home:  - S/p repair 12/9 per Dr. Margrette. - Weight bearing recommendations per orthopedics.  - VTE ppx Lovenox  - Pain control recommendations per orthopedics - Will need SNF for therapies  Acute hypoxic respiratory failure: No hx tobacco use, COPD, inhalers. Has a recent cough but clear CXR. No pleuritic chest pain or known DVT/PE risk factors, CTA negative for PE - CTA chest >> simple aspiration, treated with IV Unasyn , switch to Augmentin  12/12 - Dilated pulmonary artery and contrast reflux, with cardiomegaly, concern for right-sided heart failure, echocardiogram with significant right  ventricular dysfunction, pulmonary hypertension.  RV failure Severe pulmonary hypertension Hypotension: Hypotension 12/10 has resolved - Hypotension and lactic acidosis was secondary to hypovolemia. - This has improved with IV fluids. - Mild pulmonary edema today, Lasix  20 mg given. - Cardiology consulted to severe RV dysfunction. - PICC line ordered for CVP monitoring, Coox, patient will likely benefit from evaluation by heart failure team. - Will continue to diurese over the weekend   hypothyroidism: TSH 3.600.   - Continue synthroid    Anxiety, depression: - Sertraline   - Continue alprazolam  0.5mg  TID prn   HTN:  - Sinus bradycardia noted with 1st deg AVB.  Will avoid beta-blocker. Will discontinue lisinopril  for now due to hypotension   HLD:  - Atorvastatin  20mg , note pravastatin allergy.   Neuropathy, osteoarthritis:  - Tylenol  prn mild pain, continue home lyrica  200mg  BID   Thrombocytopenia:  - Mild, stable. Pending ortho recommendations, will continue heparin  for VTE ppx.    DNR:   Discussion: SNF  Discussed with daughter at the bedside  Gilmer Kaminsky, MD [facility] [email or web address] 03/18/2024, 5:04 PM

## 2024-03-18 NOTE — Consult Note (Addendum)
 WOC Nurse Consult Note: Reason for Consult: abdominal wound  Wound type: full thickness R sided pannus  R/t intertriginous dermatitis  ICD-10 CM Codes for Irritant Dermatitis  L30.4  - Erythema intertrigo. Also used for abrasion of the hand, chafing of the skin, dermatitis due to sweating and friction, friction dermatitis, friction eczema, and genital/thigh intertrigo.  Pressure Injury POA: NA not pressure  Measurement: see nursing flowsheet  Wound bed: linear, red dry with some scattered tan  Drainage (amount, consistency, odor) see nursing flowsheet  Periwound: erythema with scattered partial thickness skin loss r/t moisture and friction  Dressing procedure/placement/frequency: Cleanse R abdominal wound with Vashe, do not rinse. Apply a piece of silver hydrofiber Soila [phone] [employer]) to wound bed R pannus/abdominal fold. Cleanse underneath rest of pannus/abdominal fold with soap and water , dry and apply Interdry [employer] # [id number] to entire area as follows:  Measure and cut length of InterDry to fit in skin folds that have skin breakdown Tuck InterDry fabric into skin folds in a single layer, allow for 2 inches of overhang from skin edges to allow for wicking to occur May remove to bathe; dry area thoroughly and then tuck into affected areas again Do not apply any creams or ointments when using InterDry DO NOT THROW AWAY FOR 5 DAYS unless soiled with stool DO NOT [facility] product, this will inactivate the silver in the material  New sheet of Interdry should be applied after 5 days of use if patient continues to have skin breakdown    POC discussed with bedside nurse.  Appreciate N. Rone, RN assistance with this consult. WOC team will not follow Reconsult if further needs arise.   Thank you,    Powell Bar MSN, RN-BC, [employer]

## 2024-03-18 NOTE — Plan of Care (Signed)
°  Problem: Education: Goal: Knowledge of General Education information will improve Description: Including pain rating scale, medication(s)/side effects and non-pharmacologic comfort measures Outcome: Progressing   Problem: Health Behavior/Discharge Planning: Goal: Ability to manage health-related needs will improve Outcome: Progressing   Problem: Clinical Measurements: Goal: Ability to maintain clinical measurements within normal limits will improve Outcome: Progressing Goal: Will remain free from infection Outcome: Progressing Goal: Diagnostic test results will improve Outcome: Progressing Goal: Respiratory complications will improve Outcome: Progressing Goal: Cardiovascular complication will be avoided Outcome: Progressing   Problem: Activity: Goal: Risk for activity intolerance will decrease Outcome: Progressing   Problem: Nutrition: Goal: Adequate nutrition will be maintained Outcome: Progressing   Problem: Elimination: Goal: Will not experience complications related to bowel motility Outcome: Progressing   Problem: Pain Managment: Goal: General experience of comfort will improve and/or be controlled Outcome: Progressing   Problem: Safety: Goal: Ability to remain free from injury will improve Outcome: Progressing   Problem: Skin Integrity: Goal: Risk for impaired skin integrity will decrease Outcome: Progressing   Problem: Education: Goal: Verbalization of understanding the information provided (i.e., activity precautions, restrictions, etc) will improve Outcome: Progressing Goal: Individualized Educational Video(s) Outcome: Progressing   Problem: Activity: Goal: Ability to ambulate and perform ADLs will improve Outcome: Progressing   Problem: Clinical Measurements: Goal: Postoperative complications will be avoided or minimized Outcome: Progressing   Problem: Self-Concept: Goal: Ability to maintain and perform role responsibilities to the fullest  extent possible will improve Outcome: Progressing   Problem: Pain Management: Goal: Pain level will decrease Outcome: Progressing

## 2024-03-19 ENCOUNTER — Inpatient Hospital Stay (HOSPITAL_COMMUNITY)

## 2024-03-19 DIAGNOSIS — J9601 Acute respiratory failure with hypoxia: Secondary | ICD-10-CM | POA: Diagnosis not present

## 2024-03-19 DIAGNOSIS — I50810 Right heart failure, unspecified: Secondary | ICD-10-CM | POA: Diagnosis not present

## 2024-03-19 DIAGNOSIS — S72001A Fracture of unspecified part of neck of right femur, initial encounter for closed fracture: Secondary | ICD-10-CM | POA: Diagnosis not present

## 2024-03-19 LAB — BASIC METABOLIC PANEL WITH GFR
Anion gap: 5 (ref 5–15)
BUN: 11 mg/dL (ref 8–23)
CO2: 38 mmol/L — ABNORMAL HIGH (ref 22–32)
Calcium: 8.5 mg/dL — ABNORMAL LOW (ref 8.9–10.3)
Chloride: 93 mmol/L — ABNORMAL LOW (ref 98–111)
Creatinine, Ser: 0.54 mg/dL (ref 0.44–1.00)
GFR, Estimated: >60 mL/min (ref >60)
Glucose, Bld: 110 mg/dL — ABNORMAL HIGH (ref 70–99)
Potassium: 3.5 mmol/L (ref 3.5–5.1)
Sodium: 136 mmol/L (ref 135–145)

## 2024-03-19 LAB — URINALYSIS, ROUTINE W REFLEX MICROSCOPIC
Bilirubin Urine: NEGATIVE
Glucose, UA: NEGATIVE mg/dL
Hgb urine dipstick: NEGATIVE
Ketones, ur: NEGATIVE mg/dL
Leukocytes,Ua: NEGATIVE
Nitrite: NEGATIVE
Protein, ur: NEGATIVE mg/dL
Specific Gravity, Urine: 1.013 (ref 1.005–1.030)
pH: 8 (ref 5.0–8.0)

## 2024-03-19 LAB — LACTIC ACID, PLASMA: Lactic Acid, Venous: 1 mmol/L (ref 0.5–1.9)

## 2024-03-19 LAB — RESP PANEL BY RT-PCR (RSV, FLU A&B, COVID)  RVPGX2
Influenza A by PCR: NEGATIVE
Influenza B by PCR: NEGATIVE
Resp Syncytial Virus by PCR: NEGATIVE
SARS Coronavirus 2 by RT PCR: NEGATIVE

## 2024-03-19 LAB — MAGNESIUM: Magnesium: 2 mg/dL (ref 1.7–2.4)

## 2024-03-19 MED ORDER — SODIUM CHLORIDE 0.9 % IV SOLN
2.0000 g | Freq: Two times a day (BID) | INTRAVENOUS | Status: DC
  Administered 2024-03-19 – 2024-03-20 (×2): 2 g via INTRAVENOUS
  Filled 2024-03-19 (×3): qty 12.5

## 2024-03-19 MED ORDER — IPRATROPIUM-ALBUTEROL 0.5-2.5 (3) MG/3ML IN SOLN: 3.0000 mL | Freq: Four times a day (QID) | RESPIRATORY_TRACT | Status: DC | PRN

## 2024-03-19 MED ORDER — ACETAZOLAMIDE SODIUM 500 MG IJ SOLR
250.0000 mg | Freq: Once | INTRAMUSCULAR | Status: AC
  Administered 2024-03-19: 250 mg via INTRAVENOUS
  Filled 2024-03-19: qty 500

## 2024-03-19 MED ORDER — STERILE WATER FOR INJECTION IJ SOLN
INTRAMUSCULAR | Status: AC
  Filled 2024-03-19: qty 10

## 2024-03-19 MED ORDER — VANCOMYCIN HCL 1500 MG/300ML IV SOLN
1500.0000 mg | Freq: Once | INTRAVENOUS | Status: AC
  Administered 2024-03-19: 1500 mg via INTRAVENOUS
  Filled 2024-03-19: qty 300

## 2024-03-19 MED ORDER — POTASSIUM CHLORIDE 20 MEQ PO PACK
40.0000 meq | PACK | Freq: Once | ORAL | Status: AC
  Administered 2024-03-19: 40 meq via ORAL
  Filled 2024-03-19: qty 2

## 2024-03-19 MED ORDER — PREGABALIN 50 MG PO CAPS
50.0000 mg | ORAL_CAPSULE | Freq: Two times a day (BID) | ORAL | Status: DC
  Administered 2024-03-19 – 2024-03-21 (×4): 50 mg via ORAL
  Filled 2024-03-19: qty 1
  Filled 2024-03-19 (×4): qty 2

## 2024-03-19 MED ORDER — VANCOMYCIN HCL 750 MG/150ML IV SOLN: 750.0000 mg | INTRAVENOUS | Status: DC

## 2024-03-19 MED ORDER — METOPROLOL SUCCINATE ER 25 MG PO TB24
25.0000 mg | ORAL_TABLET | Freq: Every day | ORAL | Status: DC
  Administered 2024-03-20 – 2024-03-21 (×2): 25 mg via ORAL
  Filled 2024-03-19 (×3): qty 1

## 2024-03-19 NOTE — Progress Notes (Signed)
 Pharmacy Antibiotic Note  Mary Marks is a 88 y.o. female admitted on 03/13/2024 with fevers  Pharmacy has been consulted for Vancomycin  and cefepime  dosing. Pt is s/p femoral neck fx repair done 03/14/24. Patient had temp spikes to 101, empiric tx initiated.  Plan: Vancomycin  1500mg  IV loading dose, then 750 mg IV Q 24 hrs. Goal AUC 400-550. Expected AUC: 503 SCr used: 0.8(actual is 0.54) Cefepime  2gm IV q12h F/U cxs and clinical progress Monitor V/S, labs and levels as indicated   Height: 5' (152.4 cm) Weight: 87.4 kg (192 lb 10.9 oz) IBW/kg (Calculated) : 45.5  Temp (24hrs), Avg:99.8 F (37.7 C), Min:98.5 F (36.9 C), Max:101 F (38.3 C)  Recent Labs  Lab 03/14/24 0516 03/15/24 0447 03/15/24 1047 03/15/24 1624 03/16/24 0502 03/17/24 0607 03/17/24 1330 03/17/24 2150 03/18/24 0439 03/19/24 0617  WBC 11.9* 12.3*  --   --  10.1 8.5  --   --  10.5  --   CREATININE 0.63  --  0.84  --  0.83 0.58  --  0.60 0.56 0.54  LATICACIDVEN  --   --  3.3* 1.7  --   --  1.1  --   --   --     Estimated Creatinine Clearance: 49.6 mL/min (by C-G formula based on SCr of 0.54 mg/dL).    Allergies[1]  Antimicrobials this admission: vancomycin  12/14 >>  Cefepime  12/14>> unasyn  12/8 >> 12/12 Augmentin  12/12> 12/14  Microbiology results: 12/14 BCx: pending 12/8  MRSA PCR: neg  Thank you for allowing pharmacy to be a part of this patients care.  Adaria Hole, BS Pharm D, BCPS Clinical Pharmacist 03/19/2024 8:53 AM     [1]  Allergies Allergen Reactions   Pravastatin Dermatitis and Rash

## 2024-03-19 NOTE — Plan of Care (Signed)
°  Problem: Education: Goal: Knowledge of General Education information will improve Description: Including pain rating scale, medication(s)/side effects and non-pharmacologic comfort measures Outcome: Progressing   Problem: Health Behavior/Discharge Planning: Goal: Ability to manage health-related needs will improve Outcome: Progressing   Problem: Clinical Measurements: Goal: Ability to maintain clinical measurements within normal limits will improve Outcome: Progressing Goal: Will remain free from infection Outcome: Progressing Goal: Diagnostic test results will improve Outcome: Progressing Goal: Respiratory complications will improve Outcome: Progressing Goal: Cardiovascular complication will be avoided Outcome: Progressing   Problem: Activity: Goal: Risk for activity intolerance will decrease Outcome: Progressing   Problem: Nutrition: Goal: Adequate nutrition will be maintained Outcome: Progressing   Problem: Elimination: Goal: Will not experience complications related to bowel motility Outcome: Progressing   Problem: Pain Managment: Goal: General experience of comfort will improve and/or be controlled Outcome: Progressing   Problem: Safety: Goal: Ability to remain free from injury will improve Outcome: Progressing   Problem: Skin Integrity: Goal: Risk for impaired skin integrity will decrease Outcome: Progressing   Problem: Education: Goal: Verbalization of understanding the information provided (i.e., activity precautions, restrictions, etc) will improve Outcome: Progressing Goal: Individualized Educational Video(s) Outcome: Progressing   Problem: Activity: Goal: Ability to ambulate and perform ADLs will improve Outcome: Progressing   Problem: Clinical Measurements: Goal: Postoperative complications will be avoided or minimized Outcome: Progressing   Problem: Self-Concept: Goal: Ability to maintain and perform role responsibilities to the fullest  extent possible will improve Outcome: Progressing   Problem: Pain Management: Goal: Pain level will decrease Outcome: Progressing

## 2024-03-19 NOTE — Progress Notes (Signed)
 [facility] PROGRESS NOTE  Mary Marks (DOB: 12/10/1935) FMW:[id number] PCP: [name] [name], FNP  Brief Narrative: Mary Marks is a 88 y.o. female with a history of OA, HTN, HLD, hypothyroidism who presented to the ED on 03/13/2024 after a fall at home, found to have a displaced right femoral neck fracture. Also hypoxemic with workup suggesting aspiration pneumonia, cardiomegaly, dilated pulmonary artery by CTA. Echocardiogram shows right heart dysfunction and severe pulmonary hypertension. Patient is seen by cardiology, plan for PICC line, CVP monitoring, CoOx  Subjective: Patient seen and examined at the bedside earlier today.  She has been febrile since this morning.  Confused and does not follow commands consistently.   Objective: BP 126/82   Pulse 83   Temp (!) 101.7 F (38.7 C)   Resp 16   Ht 5' (1.524 m)   Wt 87.4 kg   SpO2 97%   BMI 37.63 kg/m   Gen: Elderly female, not in distress, confused, mumbling Pulm: Mild crackles bilaterally, nonlabored breathing CV: Regular, no murmur, tachycardic GI: Soft, NT, ND, +BS Neuro: Confused, PERRLA, EOMI, motor strength could not be assessed  Ext: Warm, dry, trace edema bilaterally, thigh dressing intact and clean Skin: Purpura on the chest skin  Assessment & Plan: Right femoral neck fracture s/p fall at home:  - S/p repair 12/9 per Dr. Margrette. - Weight bearing recommendations per orthopedics.  - VTE ppx Lovenox  - Pain control recommendations per orthopedics.  Will discontinue morphine  and limit opiates due to altered mental status - Will need SNF for therapies  Acute hypoxic respiratory failure: No hx tobacco use, COPD, inhalers. Has a recent cough but clear CXR. No pleuritic chest pain or known DVT/PE risk factors, CTA negative for PE - CTA chest on admission plus/9 was concerning for aspiration, no PE.  Patient was initiated on IV Unasyn  and switched to Augmentin  12/12 however developed fever on 12/14, antibiotics  broadened to Vanco and cefepime . Will also obtain CT chest without contrast echocardiogram with significant right ventricular dysfunction, pulmonary hypertension.  RV failure Severe pulmonary hypertension Hypotension: Hypotension 12/10 has resolved - Hypotension and lactic acidosis was secondary to hypovolemia. - This has improved with IV fluids. - Mild pulmonary edema and received IV diuretics.  Contraction alkalosis noted.  Lasix  discontinued -IV Diamox  given 12/14 - Cardiology consulted to severe RV dysfunction. - PICC line ordered for CVP monitoring, Coox, patient will likely benefit from evaluation by heart failure team. - Will continue to diurese over the weekend  Fever and encephalopathy: Fever source likely pulmonary.  Will obtain chest x-ray Broaden antibiotics Obtain UA with culture, blood culture Encephalopathy likely exacerbated by medications including opiates, alprazolam , Lyrica .  Will hold them  hypothyroidism: TSH 3.600.   - Continue synthroid    Anxiety, depression: - Sertraline   - Continue alprazolam  0.5mg  TID prn   HTN:  - Sinus bradycardia noted with 1st deg AVB.  Was taking metoprolol  prior to admission which was on hold.  Will restart at a lower dose due to rebound tachycardia. Will discontinue lisinopril  for now due to hypotension   HLD:  - Atorvastatin  20mg , note pravastatin allergy.   Neuropathy, osteoarthritis:  - Tylenol  prn mild pain, continue home lyrica  200mg  BID   Thrombocytopenia:  - Mild, stable. Pending ortho recommendations, will continue heparin  for VTE ppx.    DNR:   Disposition: Skilled nursing facility  Derryl Duval, MD [facility] [email or web address] 03/19/2024, 1:59 PM

## 2024-03-20 LAB — CBC WITH DIFFERENTIAL/PLATELET
Abs Immature Granulocytes: 0.17 K/uL — ABNORMAL HIGH (ref 0.00–0.07)
Basophils Absolute: 0 K/uL (ref 0.0–0.1)
Basophils Relative: 0 %
Eosinophils Absolute: 0.1 K/uL (ref 0.0–0.5)
Eosinophils Relative: 1 %
HCT: 38.9 % (ref 36.0–46.0)
Hemoglobin: 12.5 g/dL (ref 12.0–15.0)
Immature Granulocytes: 2 %
Lymphocytes Relative: 14 %
Lymphs Abs: 1.5 K/uL (ref 0.7–4.0)
MCH: 28.3 pg (ref 26.0–34.0)
MCHC: 32.1 g/dL (ref 30.0–36.0)
MCV: 88 fL (ref 80.0–100.0)
Monocytes Absolute: 1.2 K/uL — ABNORMAL HIGH (ref 0.1–1.0)
Monocytes Relative: 11 %
Neutro Abs: 8.1 K/uL — ABNORMAL HIGH (ref 1.7–7.7)
Neutrophils Relative %: 72 %
Platelets: 189 K/uL (ref 150–400)
RBC: 4.42 MIL/uL (ref 3.87–5.11)
RDW: 13.9 % (ref 11.5–15.5)
WBC: 11.1 K/uL — ABNORMAL HIGH (ref 4.0–10.5)
nRBC: 0 % (ref 0.0–0.2)

## 2024-03-20 LAB — BASIC METABOLIC PANEL WITH GFR
Anion gap: 5 (ref 5–15)
BUN: 16 mg/dL (ref 8–23)
CO2: 38 mmol/L — ABNORMAL HIGH (ref 22–32)
Calcium: 8.8 mg/dL — ABNORMAL LOW (ref 8.9–10.3)
Chloride: 95 mmol/L — ABNORMAL LOW (ref 98–111)
Creatinine, Ser: 0.46 mg/dL (ref 0.44–1.00)
GFR, Estimated: >60 mL/min (ref >60)
Glucose, Bld: 101 mg/dL — ABNORMAL HIGH (ref 70–99)
Potassium: 3.3 mmol/L — ABNORMAL LOW (ref 3.5–5.1)
Sodium: 138 mmol/L (ref 135–145)

## 2024-03-20 MED ORDER — POTASSIUM CHLORIDE CRYS ER 20 MEQ PO TBCR
40.0000 meq | EXTENDED_RELEASE_TABLET | Freq: Four times a day (QID) | ORAL | Status: AC
  Administered 2024-03-20 (×2): 40 meq via ORAL
  Filled 2024-03-20 (×2): qty 2

## 2024-03-20 MED ORDER — SODIUM CHLORIDE 0.9 % IV SOLN
3.0000 g | Freq: Four times a day (QID) | INTRAVENOUS | Status: DC
  Administered 2024-03-20 – 2024-03-21 (×5): 3 g via INTRAVENOUS
  Filled 2024-03-20 (×8): qty 8

## 2024-03-20 MED ORDER — FUROSEMIDE 40 MG PO TABS
40.0000 mg | ORAL_TABLET | Freq: Every day | ORAL | Status: DC
  Administered 2024-03-20 – 2024-03-21 (×2): 40 mg via ORAL
  Filled 2024-03-20 (×2): qty 1

## 2024-03-20 NOTE — Progress Notes (Signed)
 Rounding Note   Patient Name: Mary Marks Date of Encounter: 03/20/2024   [facility] Cardiologist: New  Subjective No complaints  Scheduled Meds:  atorvastatin   20 mg Oral Daily   Chlorhexidine  Gluconate Cloth  6 each Topical Daily   cholecalciferol   1,000 Units Oral Daily   docusate sodium   100 mg Oral BID   enoxaparin  (LOVENOX ) injection  40 mg Subcutaneous Q24H   furosemide   40 mg Intravenous Daily   levothyroxine   112 mcg Oral q morning   metoprolol  succinate  25 mg Oral Daily   pantoprazole   40 mg Oral Daily   pregabalin   50 mg Oral BID   sertraline   100 mg Oral Daily   sodium chloride  flush  10-40 mL Intracatheter Q12H   sodium chloride  flush  3 mL Intravenous Q12H   Continuous Infusions:  ceFEPime  (MAXIPIME ) IV 2 g (03/20/24 0019)   vancomycin      PRN Meds: acetaminophen , alum & mag hydroxide-simeth, bisacodyl , HYDROcodone -acetaminophen , HYDROcodone -acetaminophen , Influenza vac split trivalent PF, ipratropium-albuterol , metoCLOPramide  **OR** metoCLOPramide  (REGLAN ) injection, morphine  injection, ondansetron  **OR** ondansetron  (ZOFRAN ) IV, mouth rinse, polyethylene glycol, sodium chloride  flush   Vital Signs  Vitals:   03/20/24 0400 03/20/24 0500 03/20/24 0600 03/20/24 0747  BP: 122/64 116/65 111/68   Pulse: 76 71 79   Resp: 19 17 (!) 27   Temp:    98.7 F (37.1 C)  TempSrc:    Rectal  SpO2: 95% 94% 92%   Weight:      Height:        Intake/Output Summary (Last 24 hours) at 03/20/2024 0806 Last data filed at 03/20/2024 0519 Gross per 24 hour  Intake 792.03 ml  Output 1950 ml  Net -1157.97 ml      03/15/2024   12:17 PM 03/14/2024    9:38 AM 03/13/2024   11:50 AM  Last 3 Weights  Weight (lbs) 192 lb 10.9 oz 175 lb 175 lb  Weight (kg) 87.4 kg 79.379 kg 79.379 kg      Telemetry SR. Isolated short run SVT - Personally Reviewed  ECG  N/a - Personally Reviewed  Physical Exam  GEN: No acute distress.   Neck: No JVD Cardiac: RRR, no  murmurs, rubs, or gallops.  Respiratory: mild coarse breath sounds bilaterlaly GI: Soft, nontender, non-distended  MS: No edema; No deformity. Neuro:  Nonfocal  Psych: Normal affect   Labs High Sensitivity Troponin:  No results for input(s): TROPONINIHS in the last 720 hours. No results for input(s): TRNPT in the last 720 hours.     Chemistry Recent Labs  Lab 03/17/24 2150 03/18/24 0439 03/19/24 0617 03/20/24 0426  NA 143 142 136 138  K 3.0* 3.4* 3.5 3.3*  CL 98 98 93* 95*  CO2 31 40* 38* 38*  GLUCOSE 104* 97 110* 101*  BUN 13 13 11 16   CREATININE 0.60 0.56 0.54 0.46  CALCIUM  8.4* 8.2* 8.5* 8.8*  MG 1.7  --  2.0  --   GFRNONAA >60 >60 >60 >60  ANIONGAP 14 4* 5 5    Lipids No results for input(s): CHOL, TRIG, HDL, LABVLDL, LDLCALC, CHOLHDL in the last 168 hours.  Hematology Recent Labs  Lab 03/17/24 0607 03/18/24 0439 03/20/24 0426  WBC 8.5 10.5 11.1*  RBC 3.92 4.12 4.42  HGB 11.2* 11.8* 12.5  HCT 35.7* 37.0 38.9  MCV 91.1 89.8 88.0  MCH 28.6 28.6 28.3  MCHC 31.4 31.9 32.1  RDW 15.0 14.6 13.9  PLT 138* 151 189   Thyroid   Recent  Labs  Lab 03/13/24 1542  TSH 3.600    BNP Recent Labs  Lab 03/13/24 1542  PROBNP 8,312.0*    DDimer No results for input(s): DDIMER in the last 168 hours.   Radiology  CT CHEST WO CONTRAST Result Date: 03/19/2024 CLINICAL DATA:  Pneumonia, complication suspected. EXAM: CT CHEST WITHOUT CONTRAST TECHNIQUE: Multidetector CT imaging of the chest was performed following the standard protocol without IV contrast. RADIATION DOSE REDUCTION: This exam was performed according to the departmental dose-optimization program which includes automated exposure control, adjustment of the mA and/or kV according to patient size and/or use of iterative reconstruction technique. COMPARISON:  11/24/2023. FINDINGS: Cardiovascular: Heart is enlarged and there is no pericardial effusion. Multi-vessel coronary artery calcifications are  noted. There is atherosclerotic calcification of the aorta without evidence of aneurysm. The pulmonary trunk is distended suggesting underlying pulmonary artery hypertension. A right PICC line terminates at the cavoatrial junction. Mediastinum/Nodes: Enlarged lymph nodes are present in the mediastinum, likely reactive. No axillary lymphadenopathy. Evaluation of the hilar regions is limited due to lack of IV contrast. The thyroid  gland and trachea are within normal limits. Debris is noted in the proximal esophagus. Lungs/Pleura: There is a trace right pleural effusion and a small left pleural effusion. Ground-glass attenuation and patchy opacities are present bilaterally. There is a trace right pleural effusion. There is improved aeration of the right upper lobe. No pneumothorax is seen. Upper Abdomen: No acute abnormality. Musculoskeletal: Degenerative changes are present in the thoracic spine. A stable compression deformity is noted in the inferior endplate at T12. No acute osseous abnormality. IMPRESSION: 1. Ground-glass attenuation and patchy opacities in the lungs bilaterally and improved aeration of the right upper lobe. 2. Trace right pleural effusion and small left pleural effusion. 3. Dilated pulmonary trunk suggesting underlying pulmonary artery hypertension. 4. Cardiomegaly with coronary artery calcifications. 5. Aortic atherosclerosis. Electronically Signed   By: Mary Marks M.D.   On: 03/19/2024 18:49    Cardiac Studies   Patient Profile   Mary Marks presented to [facility] ED on 03/13/2024 after suffering a mechanical fall at home and family found her on the ground the following day. She was found to have a displaced right femoral neck fracture and underwent bipolar replacement of her right hip by Dr. Margrette on 03/14/2024.   Assessment & Plan  1. RV dysfunction/pulmonary HTN/HFpEF - 03/2024 echo: LVE 60-65%, no WMAs, mod LVH, indet diastolic function, D shaped septum, mod to severe RV  dysfunction, severe RVE, dilated fixed IVC, normal LA, PASP 61 - - CXR vascular congestion, proBNP 8312 - chronicity of RV dysfunction is unclear, no prior echos. Presented hypoxic sats in the 80s, diagnosed with aspiration pneumonia - CT PE negative for acute PE - no smoking history - no autoimmune history - BMI 37, risk for OSA and OHS - normal LA, would argue against long standing elevated left sided pressures  - on IV lasix  40mg  daily. I/Os somewhat incomplete, negative roughly 1.1 L yesterday. Renal function is stable. CVP is 4 COOX previously 66%, 69% - appears euvolemic, normal CVP. CHange to oral lasix  40mg  daily.   -I suspect she likely had some chronic underlying RV dysfunction, exacerbation in setting of pneumona/hypoxia, surgery and IVFs during admission. Appears euvolemic now, CVP at 4. BP's stable, on 2L [place] with recent escalation of antibiotics for ongoing pneumonia - would plan for further workup as outaptient with PFTs, sleep study, VQ. Order ANA, RF factor     2.Aspiration pneumonia - on  IV abx per primary team - fever 12/14, abx broadened.    3. Hypotension - transient hypotension earlier in admission, had resolved with IVFs manged by primary team - was on midodrine  now off - coox was WNL  4. Right femoral neck fracture - s/p surgery this admission  We will follow patient peripherally tomorrow.      For questions or updates, please contact [facility] Please consult [email or web address] for contact info under       Signed, Alvan Carrier, MD  03/20/2024, 8:06 AM

## 2024-03-20 NOTE — Progress Notes (Signed)
 PROGRESS NOTE    Mary Marks  FMW:[id number] DOB: 1935-12-12 DOA: 03/13/2024 PCP: [name] [name], FNP   Brief Narrative:    Mary Marks is a 88 y.o. female with a history of OA, HTN, HLD, hypothyroidism who presented to the ED on 03/13/2024 after a fall at home, found to have a displaced right femoral neck fracture. Also hypoxemic with workup suggesting aspiration pneumonia, cardiomegaly, dilated pulmonary artery by CTA. Echocardiogram shows right heart dysfunction and severe pulmonary hypertension.  She is being seen by cardiology and now appears euvolemic and is being transition to oral Lasix .  Assessment & Plan:   Principal Problem:   Displaced fracture of right femoral neck (HCC) Active Problems:   Osteoarthritis of knee   Hypertension   Hypothyroidism   Depression   Hyperlipidemia   Acute hypoxic respiratory failure (HCC)   Cardiomegaly   DNR (do not resuscitate)  Assessment and Plan:   Right femoral neck fracture s/p fall at home:  - S/p repair 12/9 per Dr. Margrette. - Weight bearing recommendations per orthopedics.  - VTE ppx Lovenox  - Pain control recommendations per orthopedics.  Will discontinue morphine  and limit opiates due to altered mental status - Will need SNF for therapies once ready for dc   Acute hypoxic respiratory failure multifactorial with aspiration pneumonia: No hx tobacco use, COPD, inhalers. Has a recent cough but clear CXR. No pleuritic chest pain or known DVT/PE risk factors, CTA negative for PE - CTA chest on admission plus/9 was concerning for aspiration, no PE.  Patient was initiated on IV Unasyn  and switched to Augmentin  12/12 however developed fever on 12/14, antibiotics broadened to Vanco and cefepime . -Antibiotic switched to Unasyn  on 12/15 with no further need for vancomycin    RV failure Severe pulmonary hypertension Hypotension: Hypotension 12/10 has resolved - Hypotension and lactic acidosis was secondary to hypovolemia. - This has  improved with IV fluids. - Mild pulmonary edema and received IV diuretics.  Contraction alkalosis noted.  Lasix  discontinued -IV Diamox  given 12/14 - PICC line ordered for CVP monitoring, Coox, patient will likely benefit from evaluation by heart failure team. - Appreciate cardiology recommendations with switch to oral Lasix  on 12/15   Fever and encephalopathy: Fever source likely pulmonary.  Will obtain chest x-ray Broaden antibiotics Obtain UA with culture, blood culture Encephalopathy likely exacerbated by medications including opiates, alprazolam , Lyrica .  Will hold them   hypothyroidism: TSH 3.600.   - Continue synthroid    Anxiety, depression: - Sertraline   - Continue alprazolam  0.5mg  TID prn   HTN:  - Sinus bradycardia noted with 1st deg AVB.  Was taking metoprolol  prior to admission which was on hold.  Will restart at a lower dose due to rebound tachycardia. Will discontinue lisinopril  for now due to hypotension   HLD:  - Atorvastatin  20mg , note pravastatin allergy.   Neuropathy, osteoarthritis:  - Tylenol  prn mild pain, continue home lyrica  200mg  BID    DVT prophylaxis:Lovenox  Code Status: DNR Family Communication: None at bedside Disposition Plan:  Status is: Inpatient Remains inpatient appropriate because: Need for IV medications.   Consultants:  Cardiology  Procedures:  None  Antimicrobials:  Anti-infectives (From admission, onward)    Start     Dose/Rate Route Frequency Ordered Stop   03/20/24 1200  Ampicillin -Sulbactam (UNASYN ) 3 g in sodium chloride  0.9 % 100 mL IVPB        3 g 200 mL/hr over 30 Minutes Intravenous Every 6 hours 03/20/24 1117     03/20/24 1130  vancomycin  (VANCOREADY) IVPB 750 mg/150 mL  Status:  Discontinued       Placed in Followed by Linked Group   750 mg 150 mL/hr over 60 Minutes Intravenous Every 24 hours 03/19/24 1041 03/20/24 1117   03/19/24 1130  vancomycin  (VANCOREADY) IVPB 1500 mg/300 mL       Placed in  Followed by Linked Group   1,500 mg 150 mL/hr over 120 Minutes Intravenous  Once 03/19/24 1041 03/19/24 1347   03/19/24 1130  ceFEPIme  (MAXIPIME ) 2 g in sodium chloride  0.9 % 100 mL IVPB  Status:  Discontinued        2 g 200 mL/hr over 30 Minutes Intravenous Every 12 hours 03/19/24 1041 03/20/24 1117   03/17/24 1000  amoxicillin -clavulanate (AUGMENTIN ) 875-125 MG per tablet 1 tablet  Status:  Discontinued        1 tablet Oral Every 12 hours 03/17/24 0807 03/19/24 0749   03/14/24 0600  ceFAZolin  (ANCEF ) IVPB 2g/100 mL premix        2 g 200 mL/hr over 30 Minutes Intravenous On call to O.R. 03/13/24 1915 03/14/24 1111   03/13/24 1815  Ampicillin -Sulbactam (UNASYN ) 3 g in sodium chloride  0.9 % 100 mL IVPB  Status:  Discontinued        3 g 200 mL/hr over 30 Minutes Intravenous Every 6 hours 03/13/24 1722 03/17/24 0806       Subjective: Patient seen and evaluated today with no new acute complaints or concerns. No acute concerns or events noted overnight.  Objective: Vitals:   03/20/24 0900 03/20/24 0903 03/20/24 1000 03/20/24 1100  BP: (!) 147/83 (!) 147/83 106/64   Pulse:  79 79   Resp: 18     Temp:    98.5 F (36.9 C)  TempSrc:    Rectal  SpO2:   94%   Weight:      Height:        Intake/Output Summary (Last 24 hours) at 03/20/2024 1226 Last data filed at 03/20/2024 0519 Gross per 24 hour  Intake 792.03 ml  Output 1050 ml  Net -257.97 ml   Filed Weights   03/13/24 1150 03/14/24 0938 03/15/24 1217  Weight: 79.4 kg 79.4 kg 87.4 kg    Examination:  General exam: Appears calm and comfortable  Respiratory system: Clear to auscultation. Respiratory effort normal.  Nasal cannula Cardiovascular system: S1 & S2 heard, RRR.  Gastrointestinal system: Abdomen is soft Central nervous system: Alert and awake Extremities: No edema Skin: No significant lesions noted Psychiatry: Flat affect.    Data Reviewed: I have personally reviewed following labs and imaging  studies  CBC: Recent Labs  Lab 03/15/24 0447 03/15/24 1047 03/16/24 0502 03/17/24 0607 03/18/24 0439 03/20/24 0426  WBC 12.3*  --  10.1 8.5 10.5 11.1*  NEUTROABS  --   --  7.0  --  7.6 8.1*  HGB 12.7 12.4 11.8* 11.2* 11.8* 12.5  HCT 39.6 40.1 38.2 35.7* 37.0 38.9  MCV 90.4  --  90.7 91.1 89.8 88.0  PLT 138*  --  143* 138* 151 189   Basic Metabolic Panel: Recent Labs  Lab 03/17/24 0607 03/17/24 2150 03/18/24 0439 03/19/24 0617 03/20/24 0426  NA 141 143 142 136 138  K 3.7 3.0* 3.4* 3.5 3.3*  CL 106 98 98 93* 95*  CO2 23 31 40* 38* 38*  GLUCOSE 90 104* 97 110* 101*  BUN 15 13 13 11 16   CREATININE 0.58 0.60 0.56 0.54 0.46  CALCIUM  8.3* 8.4* 8.2* 8.5* 8.8*  MG  --  1.7  --  2.0  --    GFR: Estimated Creatinine Clearance: 49.6 mL/min (by C-G formula based on SCr of 0.46 mg/dL). Liver Function Tests: No results for input(s): AST, ALT, ALKPHOS, BILITOT, PROT, ALBUMIN in the last 168 hours. No results for input(s): LIPASE, AMYLASE in the last 168 hours. No results for input(s): AMMONIA in the last 168 hours. Coagulation Profile: Recent Labs  Lab 03/14/24 0516  INR 1.2   Cardiac Enzymes: No results for input(s): CKTOTAL, CKMB, CKMBINDEX, TROPONINI in the last 168 hours. BNP (last 3 results) Recent Labs    03/13/24 1542  PROBNP 8,312.0*   HbA1C: No results for input(s): HGBA1C in the last 72 hours. CBG: Recent Labs  Lab 03/17/24 2050  GLUCAP 134*   Lipid Profile: No results for input(s): CHOL, HDL, LDLCALC, TRIG, CHOLHDL, LDLDIRECT in the last 72 hours. Thyroid  Function Tests: No results for input(s): TSH, T4TOTAL, FREET4, T3FREE, THYROIDAB in the last 72 hours. Anemia Panel: No results for input(s): VITAMINB12, FOLATE, FERRITIN, TIBC, IRON, RETICCTPCT in the last 72 hours. Sepsis Labs: Recent Labs  Lab 03/15/24 1047 03/15/24 1624 03/17/24 1330 03/19/24 0823  LATICACIDVEN 3.3* 1.7 1.1 1.0     Recent Results (from the past 240 hours)  Surgical pcr screen     Status: None   Collection Time: 03/13/24  3:50 PM   Specimen: Nasal Mucosa; Nasal Swab  Result Value Ref Range Status   MRSA, PCR NEGATIVE NEGATIVE Final   Staphylococcus aureus NEGATIVE NEGATIVE Final    Comment: (NOTE) The Xpert SA Assay (FDA approved for NASAL specimens in patients 34 years of age and older), is one component of a comprehensive surveillance program. It is not intended to diagnose infection nor to guide or monitor treatment. Performed at [facility], [address] [place], [place] [address]   Culture, blood (Routine X 2) w Reflex to ID Panel     Status: None (Preliminary result)   Collection Time: 03/19/24  8:23 AM   Specimen: BLOOD  Result Value Ref Range Status   Specimen Description BLOOD LEFT ANTECUBITAL  Final   Special Requests   Final    BOTTLES DRAWN AEROBIC AND ANAEROBIC Blood Culture adequate volume   Culture   Final    NO GROWTH 1 DAY Performed at [facility], [address] [place], [place] [address]    Report Status PENDING  Incomplete  Culture, blood (Routine X 2) w Reflex to ID Panel     Status: None (Preliminary result)   Collection Time: 03/19/24  8:23 AM   Specimen: BLOOD  Result Value Ref Range Status   Specimen Description BLOOD BLOOD LEFT HAND  Final   Special Requests   Final    BOTTLES DRAWN AEROBIC AND ANAEROBIC Blood Culture adequate volume   Culture   Final    NO GROWTH 1 DAY Performed at [facility], [address], [place], [place] [address]    Report Status PENDING  Incomplete  Resp panel by RT-PCR (RSV, Flu A&B, Covid) Anterior Nasal Swab     Status: None   Collection Time: 03/19/24  2:50 PM   Specimen: Anterior Nasal Swab  Result Value Ref Range Status   SARS Coronavirus 2 by RT PCR NEGATIVE NEGATIVE Final    Comment: (NOTE) SARS-CoV-2 target nucleic acids are NOT DETECTED.  The SARS-CoV-2 RNA is generally detectable in upper respiratory specimens  during the acute phase of infection. The lowest concentration of SARS-CoV-2 viral copies this assay  can detect is 138 copies/mL. A negative result does not preclude SARS-Cov-2 infection and should not be used as the sole basis for treatment or other patient management decisions. A negative result may occur with  improper specimen collection/handling, submission of specimen other than nasopharyngeal swab, presence of viral mutation(s) within the areas targeted by this assay, and inadequate number of viral copies(<138 copies/mL). A negative result must be combined with clinical observations, patient history, and epidemiological information. The expected result is Negative.  Fact Sheet for Patients:  bloggercourse.com  Fact Sheet for Healthcare Providers:  seriousbroker.it  This test is no t yet approved or cleared by the [place]  FDA and  has been authorized for detection and/or diagnosis of SARS-CoV-2 by FDA under an Emergency Use Authorization (EUA). This EUA will remain  in effect (meaning this test can be used) for the duration of the COVID-19 declaration under Section 564(b)(1) of the Act, 21 U.S.C.section 360bbb-3(b)(1), unless the authorization is terminated  or revoked sooner.       Influenza A by PCR NEGATIVE NEGATIVE Final   Influenza B by PCR NEGATIVE NEGATIVE Final    Comment: (NOTE) The Xpert Xpress SARS-CoV-2/FLU/RSV plus assay is intended as an aid in the diagnosis of influenza from Nasopharyngeal swab specimens and should not be used as a sole basis for treatment. Nasal washings and aspirates are unacceptable for Xpert Xpress SARS-CoV-2/FLU/RSV testing.  Fact Sheet for Patients: bloggercourse.com  Fact Sheet for Healthcare Providers: seriousbroker.it  This test is not yet approved or cleared by the [place]  FDA and has been authorized for detection  and/or diagnosis of SARS-CoV-2 by FDA under an Emergency Use Authorization (EUA). This EUA will remain in effect (meaning this test can be used) for the duration of the COVID-19 declaration under Section 564(b)(1) of the Act, 21 U.S.C. section 360bbb-3(b)(1), unless the authorization is terminated or revoked.     Resp Syncytial Virus by PCR NEGATIVE NEGATIVE Final    Comment: (NOTE) Fact Sheet for Patients: bloggercourse.com  Fact Sheet for Healthcare Providers: seriousbroker.it  This test is not yet approved or cleared by the [place]  FDA and has been authorized for detection and/or diagnosis of SARS-CoV-2 by FDA under an Emergency Use Authorization (EUA). This EUA will remain in effect (meaning this test can be used) for the duration of the COVID-19 declaration under Section 564(b)(1) of the Act, 21 U.S.C. section 360bbb-3(b)(1), unless the authorization is terminated or revoked.  Performed at [facility] [facility], [address] [place], [place] [address]          Radiology Studies: CT CHEST WO CONTRAST Result Date: 03/19/2024 CLINICAL DATA:  Pneumonia, complication suspected. EXAM: CT CHEST WITHOUT CONTRAST TECHNIQUE: Multidetector CT imaging of the chest was performed following the standard protocol without IV contrast. RADIATION DOSE REDUCTION: This exam was performed according to the departmental dose-optimization program which includes automated exposure control, adjustment of the mA and/or kV according to patient size and/or use of iterative reconstruction technique. COMPARISON:  11/24/2023. FINDINGS: Cardiovascular: Heart is enlarged and there is no pericardial effusion. Multi-vessel coronary artery calcifications are noted. There is atherosclerotic calcification of the aorta without evidence of aneurysm. The pulmonary trunk is distended suggesting underlying pulmonary artery hypertension. A right PICC line terminates at the  cavoatrial junction. Mediastinum/Nodes: Enlarged lymph nodes are present in the mediastinum, likely reactive. No axillary lymphadenopathy. Evaluation of the hilar regions is limited due to lack of IV contrast. The thyroid  gland and trachea are within normal limits. Debris is noted in  the proximal esophagus. Lungs/Pleura: There is a trace right pleural effusion and a small left pleural effusion. Ground-glass attenuation and patchy opacities are present bilaterally. There is a trace right pleural effusion. There is improved aeration of the right upper lobe. No pneumothorax is seen. Upper Abdomen: No acute abnormality. Musculoskeletal: Degenerative changes are present in the thoracic spine. A stable compression deformity is noted in the inferior endplate at T12. No acute osseous abnormality. IMPRESSION: 1. Ground-glass attenuation and patchy opacities in the lungs bilaterally and improved aeration of the right upper lobe. 2. Trace right pleural effusion and small left pleural effusion. 3. Dilated pulmonary trunk suggesting underlying pulmonary artery hypertension. 4. Cardiomegaly with coronary artery calcifications. 5. Aortic atherosclerosis. Electronically Signed   By: Leita Birmingham M.D.   On: 03/19/2024 18:49        Scheduled Meds:  atorvastatin   20 mg Oral Daily   Chlorhexidine  Gluconate Cloth  6 each Topical Daily   cholecalciferol   1,000 Units Oral Daily   docusate sodium   100 mg Oral BID   enoxaparin  (LOVENOX ) injection  40 mg Subcutaneous Q24H   furosemide   40 mg Oral Daily   levothyroxine   112 mcg Oral q morning   metoprolol  succinate  25 mg Oral Daily   pantoprazole   40 mg Oral Daily   potassium chloride   40 mEq Oral Q6H   pregabalin   50 mg Oral BID   sertraline   100 mg Oral Daily   sodium chloride  flush  10-40 mL Intracatheter Q12H   Continuous Infusions:  ampicillin -sulbactam (UNASYN ) IV 3 g (03/20/24 1202)     LOS: 7 days    Time spent: 55 minutes    Mary Fritze D Maree, DO [facility]  If 7PM-7AM, please contact night-coverage [email or web address] 03/20/2024, 12:26 PM

## 2024-03-20 NOTE — Progress Notes (Signed)
 Physical Therapy Treatment Patient Details Name: Mary Marks MRN: [id number] DOB: 08/05/1935 Today's Date: 03/20/2024   History of Present Illness Mary Marks is an 88 y.o. female with medical history significant of OA, HTN, HLD, hypothyroidism, melanoma who presented to the ED from home where she had fallen. Her son supplements history, found the patient on the ground this morning and review of home camera shows she fell last night. Pt doesn't recall events leading to fall but was too weak to get herself back up off the floor. She had pain in the right hip/leg. Reports she has chronic runny nose and mild cough but no recent changes. No shortness of breath but was hypoxic in the ED. Denies chest pain, sick contacts, wheezing.    PT Comments  Patient mostly non-verbal and agreeable for therapy. Patient demonstrates slow labored movement for sitting up at bedside with c/o increasing pain in right hip with movement, tolerated completing exercises while seated at EOB, but required active assistance and repeated verbal/tactile cueing for following instructions. Patient limited to a few shuffling side steps before having to sit due to weakness and fall risk. Patient tolerated sitting up in chair after therapy. Patient will benefit from continued skilled physical therapy in hospital and recommended venue below to increase strength, balance, endurance for safe ADLs and gait.       If plan is discharge home, recommend the following: A lot of help with walking and/or transfers;A lot of help with bathing/dressing/bathroom;Assistance with cooking/housework;Assist for transportation;Help with stairs or ramp for entrance   Can travel by private vehicle     No  Equipment Recommendations  None recommended by PT    Recommendations for Other Services       Precautions / Restrictions Precautions Precautions: Fall Recall of Precautions/Restrictions: Impaired Precaution/Restrictions Comments: Direct lateral  precautions. Restrictions Weight Bearing Restrictions Per Provider Order: Yes RLE Weight Bearing Per Provider Order: Weight bearing as tolerated     Mobility  Bed Mobility Overal bed mobility: Needs Assistance Bed Mobility: Supine to Sit     Supine to sit: Mod assist     General bed mobility comments: slow labored movement with c/o increased pain RLE    Transfers Overall transfer level: Needs assistance Equipment used: Rolling walker (2 wheels) Transfers: Sit to/from Stand, Bed to chair/wheelchair/BSC Sit to Stand: Mod assist   Step pivot transfers: Mod assist, Max assist       General transfer comment: unsteady labored movement    Ambulation/Gait Ambulation/Gait assistance: Mod assist, Max assist Gait Distance (Feet): 3 Feet Assistive device: Rolling walker (2 wheels) Gait Pattern/deviations: Decreased step length - left, Decreased stance time - right, Decreased stride length, Shuffle Gait velocity: slow     General Gait Details: limited to a few shuffling side steps before having to sit due to BLE weakness and right hip pain   Stairs             Wheelchair Mobility     Tilt Bed    Modified Rankin (Stroke Patients Only)       Balance Overall balance assessment: Needs assistance Sitting-balance support: Feet supported, No upper extremity supported Sitting balance-Leahy Scale: Poor Sitting balance - Comments: fair/poor seated at EOB Postural control: Posterior lean Standing balance support: Reliant on assistive device for balance, During functional activity, Bilateral upper extremity supported Standing balance-Leahy Scale: Poor Standing balance comment: using RW  Communication Communication Communication: No apparent difficulties  Cognition Arousal: Alert Behavior During Therapy: WFL for tasks assessed/performed                           PT - Cognition Comments: mostly non-verbal, but able to  follow directions and anwser questions Following commands: Intact      Cueing Cueing Techniques: Verbal cues, Tactile cues  Exercises General Exercises - Lower Extremity Ankle Circles/Pumps: Strengthening, 10 reps, Seated, AROM, Both Long Arc Quad: 10 reps, AAROM, Strengthening, Both, Seated Hip Flexion/Marching: AAROM, Strengthening, Both, Seated, 10 reps    General Comments        Pertinent Vitals/Pain Pain Assessment Pain Assessment: Faces Faces Pain Scale: Hurts even more Pain Location: R hip with movement Pain Descriptors / Indicators: Sore, Discomfort, Grimacing Pain Intervention(s): Limited activity within patient's tolerance, Monitored during session, Repositioned    Home Living                          Prior Function            PT Goals (current goals can now be found in the care plan section) Acute Rehab PT Goals Patient Stated Goal: To be walk PT Goal Formulation: With patient Time For Goal Achievement: 03/30/24 Potential to Achieve Goals: Good Progress towards PT goals: Progressing toward goals    Frequency    Min 3X/week      PT Plan      Co-evaluation              AM-PAC PT 6 Clicks Mobility   Outcome Measure  Help needed turning from your back to your side while in a flat bed without using bedrails?: A Lot Help needed moving from lying on your back to sitting on the side of a flat bed without using bedrails?: A Lot Help needed moving to and from a bed to a chair (including a wheelchair)?: A Lot Help needed standing up from a chair using your arms (e.g., wheelchair or bedside chair)?: A Lot Help needed to walk in hospital room?: A Lot Help needed climbing 3-5 steps with a railing? : Total 6 Click Score: 11    End of Session   Activity Tolerance: Patient tolerated treatment well;Patient limited by fatigue Patient left: in chair;with call bell/phone within reach Nurse Communication: Mobility status PT Visit Diagnosis:  Unsteadiness on feet (R26.81);Muscle weakness (generalized) (M62.81);History of falling (Z91.81);Other abnormalities of gait and mobility (R26.89)     Time: 9093-9059 PT Time Calculation (min) (ACUTE ONLY): 34 min  Charges:    $Therapeutic Exercise: 8-22 mins $Therapeutic Activity: 8-22 mins PT General Charges $$ ACUTE PT VISIT: 1 Visit                     2:22 PM, 03/20/2024 Lynwood Music, MPT Physical Therapist with [facility] [phone] office [phone] mobile phone

## 2024-03-21 LAB — MAGNESIUM: Magnesium: 2.6 mg/dL — ABNORMAL HIGH (ref 1.7–2.4)

## 2024-03-21 LAB — BASIC METABOLIC PANEL WITH GFR
Anion gap: 6 (ref 5–15)
BUN: 20 mg/dL (ref 8–23)
CO2: 34 mmol/L — ABNORMAL HIGH (ref 22–32)
Calcium: 9.3 mg/dL (ref 8.9–10.3)
Chloride: 102 mmol/L (ref 98–111)
Creatinine, Ser: 0.52 mg/dL (ref 0.44–1.00)
GFR, Estimated: >60 mL/min (ref >60)
Glucose, Bld: 98 mg/dL (ref 70–99)
Potassium: 4.4 mmol/L (ref 3.5–5.1)
Sodium: 142 mmol/L (ref 135–145)

## 2024-03-21 MED ORDER — FUROSEMIDE 40 MG PO TABS: 40.0000 mg | ORAL_TABLET | Freq: Every day | ORAL | 1 refills | Status: AC

## 2024-03-21 MED ORDER — ENOXAPARIN SODIUM 40 MG/0.4ML IJ SOSY: 40.0000 mg | PREFILLED_SYRINGE | INTRAMUSCULAR | 0 refills | Status: AC

## 2024-03-21 MED ORDER — ALPRAZOLAM 0.5 MG PO TABS: 0.5000 mg | ORAL_TABLET | Freq: Two times a day (BID) | ORAL | 0 refills | Status: AC | PRN

## 2024-03-21 MED ORDER — METOPROLOL SUCCINATE ER 25 MG PO TB24: 25.0000 mg | ORAL_TABLET | Freq: Every day | ORAL | 1 refills | Status: AC

## 2024-03-21 NOTE — TOC Transition Note (Signed)
 Transition of Care [facility]) - Discharge Note   Patient Details  Name: Mary Marks MRN: [id number] Date of Birth: October 15, 1935  [facility] CM/SW Contact:  Lucie Lunger, LCSWA Phone Number: 03/21/2024, 10:56 AM  Clinical Narrative:    CSW updated that pt is medically stable for D/C to [facility] today. CSW spoke to Kristin with [facility] who confirms they are able to accept today. CSW spoke with pts daughter to provide update on D/C. Med necessity printed to the floor. Room and report to be provided to RN. EMS to be called for transport. TOC signing off.   Final next level of care: Skilled Nursing Facility Barriers to Discharge: Barriers Resolved   Patient Goals and CMS Choice Patient states their goals for this hospitalization and ongoing recovery are:: go to SNF CMS Medicare.gov Compare Post Acute Care list provided to:: Patient Choice offered to / list presented to : Patient, Adult Children      Discharge Placement              Patient chooses bed at: [facility] Patient to be transferred to facility by: EMS Name of family member notified: daughter Bascom Patient and family notified of of transfer: 03/21/24  Discharge Plan and Services Additional resources added to the After Visit Summary for                                       Social Drivers of Health (SDOH) Interventions SDOH Screenings   Food Insecurity: No Food Insecurity (03/13/2024)  Housing: Low Risk (03/13/2024)  Transportation Needs: No Transportation Needs (03/13/2024)  Utilities: Not At Risk (03/13/2024)  Social Connections: Moderately Isolated (03/13/2024)  Tobacco Use: Low Risk (03/14/2024)     Readmission Risk Interventions    03/16/2024    2:38 PM 03/14/2024    9:56 AM  Readmission Risk Prevention Plan  Post Dischage Appt Complete Complete  Medication Screening Complete Complete  Transportation Screening Complete Complete

## 2024-03-21 NOTE — Progress Notes (Signed)
° °  Chart reviewed. Transitioned to oral lasix  yesterday, appeared euvolemic and CVP down to 4. I suspect she likely had some underlying RV dysfunction exacerbated by pneumonia, hypoxia, orthopedic surgery, IVFs as inpatient for hypotension. Would plan for further outpatient testing including PFTs, OSA eval, VQ scan when further recovered from her pneumonia.   No additional cardiology recs at this time, we will sign of inpatient care and arrange outpatient f/u       Signed, Alvan Carrier, MD  03/21/2024, 9:14 AM

## 2024-03-21 NOTE — Discharge Summary (Signed)
 Physician Discharge Summary  Mary Marks FMW:[id number] DOB: January 28, 1936 DOA: 03/13/2024  PCP: [name] [name], FNP  Admit date: 03/13/2024  Discharge date: 03/21/2024  Admitted From:Home  Disposition:  SNF  Recommendations for Outpatient Follow-up:  Follow up with PCP in 1-2 weeks Patient has completed course of antibiotics for pneumonia and no longer requires any further dosing Follow-up with cardiology as scheduled on 1/13 at 8:45 AM Patient will need outpatient PFTs, OSA, and VQ scan to further evaluate pulmonary hypertension Remain on Lasix  40 mg daily Orthopedics postop appointment scheduled for follow-up in approximately 20 days Remove staples by 12/22 Continue DVT prophylaxis with Lovenox  for total 30 days Continue other home medications as noted below  Home Health: None  Equipment/Devices: 2L [place]  Discharge Condition:Stable  CODE STATUS: DNR  Diet recommendation: Heart Healthy  Brief/Interim Summary: Mary Marks is a 88 y.o. female with a history of OA, HTN, HLD, hypothyroidism who presented to the ED on 03/13/2024 after a fall at home, found to have a displaced right femoral neck fracture.  She underwent right femoral neck fracture repair on 12/9.  She developed hypoxemia with workup suggesting aspiration pneumonia, cardiomegaly, dilated pulmonary artery by CTA. Echocardiogram shows right heart dysfunction and severe pulmonary hypertension.  She was seen by cardiology and was diuresed with IV Lasix , but now appears euvolemic and was transition to oral Lasix  on 12/15.  She has completed treatment for her aspiration pneumonia with Unasyn  and no longer requires any antibiotics on discharge.  PT/OT recommending SNF and patient is now eligible for discharge to facility as delirium has improved overall and she is no longer febrile and has completed course of antibiotics and overall is euvolemic.  No other acute events or concerns noted.  Discharge Diagnoses:  Principal  Problem:   Displaced fracture of right femoral neck (HCC) Active Problems:   Osteoarthritis of knee   Hypertension   Hypothyroidism   Depression   Hyperlipidemia   Acute hypoxic respiratory failure (HCC)   Cardiomegaly   DNR (do not resuscitate)  Principal discharge diagnosis: Right femoral neck fracture s/p repair 12/9 in the setting of mechanical fall.  Acute hypoxemic respiratory failure-multifactorial with aspiration pneumonia and pulmonary hypertension.  Discharge Instructions  Discharge Instructions     Discharge wound care:   Complete by: As directed    2 times daily      Comments: Cleanse R abdominal wound with Vashe, do not rinse. Apply a piece of silver hydrofiber  Soila [phone] [employer]) to wound bed R pannus/abdominal fold. Cleanse underneath rest of pannus/abdominal fold with soap and water , dry and apply Interdry [employer] # [phone] to entire area as follows:  Measure and cut length of InterDry to fit in skin folds that have skin breakdown Tuck InterDry fabric into skin folds in a single layer, allow for 2 inches of overhang from skin edges to allow for wicking to occur May remove to bathe; dry area thoroughly and then tuck into affected areas again Do not apply any creams or ointments when using InterDry DO NOT THROW AWAY FOR 5 DAYS unless soiled with stool DO NOT [facility] product, this will inactivate the silver in the material  New sheet of Interdry should be applied after 5 days of use if patient continues to have skin breakdown   Increase activity slowly   Complete by: As directed       Allergies as of 03/21/2024       Reactions   Pravastatin Dermatitis, Rash  Medication List     STOP taking these medications    lisinopril  40 MG tablet Commonly known as: ZESTRIL        TAKE these medications    ALPRAZolam  0.5 MG tablet Commonly known as: XANAX  Take 1 tablet (0.5 mg total) by mouth 2 (two) times daily as needed for anxiety. What  changed:  when to take this reasons to take this   atorvastatin  20 MG tablet Commonly known as: LIPITOR Take 1 tablet (20 mg total) by mouth daily.   BC FAST PAIN RELIEF PO Take 1 Package by mouth 2 (two) times daily as needed (pain).   cetirizine 10 MG tablet Commonly known as: ZYRTEC Take 10 mg by mouth daily.   cholecalciferol  25 MCG (1000 UNIT) tablet Commonly known as: VITAMIN D3 Take 1,000 Units by mouth daily.   enoxaparin  40 MG/0.4ML injection Commonly known as: LOVENOX  Inject 0.4 mLs (40 mg total) into the skin daily. Start taking on: March 22, 2024   fluticasone  0.05 % cream Commonly known as: CUTIVATE  Apply 1 Application topically daily as needed (psoriasis).   furosemide  40 MG tablet Commonly known as: LASIX  Take 1 tablet (40 mg total) by mouth daily. Start taking on: March 22, 2024 What changed:  medication strength how much to take   hydrocortisone cream 1 % Apply 1 application. topically daily as needed for itching (psoriasis).   levothyroxine  112 MCG tablet Commonly known as: SYNTHROID  Take 112 mcg by mouth every morning.   metoprolol  succinate 25 MG 24 hr tablet Commonly known as: TOPROL -XL Take 1 tablet (25 mg total) by mouth daily. Start taking on: March 22, 2024 What changed:  medication strength how much to take   pregabalin  200 MG capsule Commonly known as: Lyrica  Take 1 capsule (200 mg total) by mouth 2 (two) times daily.   sertraline  100 MG tablet Commonly known as: ZOLOFT  Take 1 tablet (100 mg total) by mouth daily.               Discharge Care Instructions  (From admission, onward)           Start     Ordered   03/21/24 0000  Discharge wound care:       Comments: 2 times daily      Comments: Cleanse R abdominal wound with Vashe, do not rinse. Apply a piece of silver hydrofiber  Soila [phone] [employer]) to wound bed R pannus/abdominal fold. Cleanse underneath rest of pannus/abdominal fold with soap and  water , dry and apply Interdry [employer] # [phone] to entire area as follows:  Measure and cut length of InterDry to fit in skin folds that have skin breakdown Tuck InterDry fabric into skin folds in a single layer, allow for 2 inches of overhang from skin edges to allow for wicking to occur May remove to bathe; dry area thoroughly and then tuck into affected areas again Do not apply any creams or ointments when using InterDry DO NOT THROW AWAY FOR 5 DAYS unless soiled with stool DO NOT [facility] product, this will inactivate the silver in the material  New sheet of Interdry should be applied after 5 days of use if patient continues to have skin breakdown   03/21/24 1031            Contact information for after-discharge care     Destination     [facility] .   Service: Skilled Nursing Contact information: [address]  [place] [address] [phone]                    Allergies[1]  Consultations: Orthopedics Cardiology   Procedures/Studies: CT CHEST WO CONTRAST Result Date: 03/19/2024 CLINICAL DATA:  Pneumonia, complication suspected. EXAM: CT CHEST WITHOUT CONTRAST TECHNIQUE: Multidetector CT imaging of the chest was performed following the standard protocol without IV contrast. RADIATION DOSE REDUCTION: This exam was performed according to the departmental dose-optimization program which includes automated exposure control, adjustment of the mA and/or kV according to patient size and/or use of iterative reconstruction technique. COMPARISON:  11/24/2023. FINDINGS: Cardiovascular: Heart is enlarged and there is no pericardial effusion. Multi-vessel coronary artery calcifications are noted. There is atherosclerotic calcification of the aorta without evidence of aneurysm. The pulmonary trunk is distended suggesting underlying pulmonary artery hypertension. A right PICC line terminates at the cavoatrial junction.  Mediastinum/Nodes: Enlarged lymph nodes are present in the mediastinum, likely reactive. No axillary lymphadenopathy. Evaluation of the hilar regions is limited due to lack of IV contrast. The thyroid  gland and trachea are within normal limits. Debris is noted in the proximal esophagus. Lungs/Pleura: There is a trace right pleural effusion and a small left pleural effusion. Ground-glass attenuation and patchy opacities are present bilaterally. There is a trace right pleural effusion. There is improved aeration of the right upper lobe. No pneumothorax is seen. Upper Abdomen: No acute abnormality. Musculoskeletal: Degenerative changes are present in the thoracic spine. A stable compression deformity is noted in the inferior endplate at T12. No acute osseous abnormality. IMPRESSION: 1. Ground-glass attenuation and patchy opacities in the lungs bilaterally and improved aeration of the right upper lobe. 2. Trace right pleural effusion and small left pleural effusion. 3. Dilated pulmonary trunk suggesting underlying pulmonary artery hypertension. 4. Cardiomegaly with coronary artery calcifications. 5. Aortic atherosclerosis. Electronically Signed   By: Leita Birmingham M.D.   On: 03/19/2024 18:49   DG Chest Port 1 View Result Date: 03/17/2024 EXAM: 1 VIEW(S) XRAY OF THE CHEST 03/17/2024 03:39:36 PM COMPARISON: 03/16/2024 CLINICAL HISTORY: PICC (peripherally inserted central catheter) in place FINDINGS: LINES, TUBES AND DEVICES: Right upper extremity PICC in place with tip terminating over the superior cavoatrial junction. LUNGS AND PLEURA: Low lung volumes. Similar bibasilar opacities. Similar bilateral pleural effusions. Mild pulmonary edema. No pneumothorax. HEART AND MEDIASTINUM: Cardiomegaly. No acute abnormality of the mediastinal silhouette. BONES AND SOFT TISSUES: No acute osseous abnormality. IMPRESSION: 1. Cardiomegaly. 2. Low lung volumes with similar bibasilar opacities, bilateral pleural effusions, and mild  pulmonary edema. 3. Right upper extremity PICC with tip terminating at the superior cavoatrial junction. Electronically signed by: Greig Pique MD 03/17/2024 04:08 PM EST RP Workstation: [id number]   US  EKG SITE RITE Result Date: 03/17/2024 If Site Rite image not attached, placement could not be confirmed due to current cardiac rhythm.  DG CHEST PORT 1 VIEW Result Date: 03/16/2024 CLINICAL DATA:  Shortness of breath. EXAM: PORTABLE CHEST 1 VIEW COMPARISON:  Chest radiograph dated 03/13/2024. FINDINGS: There is cardiomegaly with vascular congestion. Small bilateral pleural effusions and bibasilar atelectasis or infiltrate. No pneumothorax. No acute osseous pathology. IMPRESSION: Cardiomegaly with vascular congestion and small bilateral pleural effusions. Electronically Signed   By: Vanetta Chou M.D.   On: 03/16/2024 18:12   ECHOCARDIOGRAM COMPLETE Result Date: 03/15/2024    ECHOCARDIOGRAM REPORT   Patient Name:   Mary Marks  Date of Exam: 03/15/2024 Medical Rec #:  [id number]  Height:       60.0 in Accession #:    [id number]  Weight:       192.7 lb Date of Birth:  08-08-1935   BSA:          1.837 m Patient Age:    88 years   BP:           116/68 mmHg Patient Gender: F          HR:           66 bpm. Exam Location:  [facility] Procedure: 2D Echo, Cardiac Doppler, Color Doppler and Strain Analysis (Both            Spectral and Color Flow Doppler were utilized during procedure). Indications:    Cardiomegaly l51.7  History:        Patient has no prior history of Echocardiogram examinations.                 Cardiomegaly; Risk Factors:Hypertension and Dyslipidemia.  Sonographer:    Aida Pizza RCS Referring Phys: [phone] STANLEY E HARRISON  Sonographer Comments: Global longitudinal strain was attempted. IMPRESSIONS  1. Left ventricular ejection fraction, by estimation, is 60 to 65%. The left ventricle has normal function. The left ventricle has no regional wall motion abnormalities. There is moderate left  ventricular hypertrophy. Left ventricular diastolic parameters are indeterminate. There is the interventricular septum is flattened in systole and diastole, consistent with right ventricular pressure and volume overload. The global longitudinal strain is abnormal.  2. Right ventricular systolic function moderately to severely reduced. The right ventricular size is severely enlarged. There is severely elevated pulmonary artery systolic pressure.  3. The mitral valve is normal in structure. No evidence of mitral valve regurgitation. No evidence of mitral stenosis.  4. The tricuspid valve is abnormal. Tricuspid valve regurgitation is moderate.  5. The aortic valve is tricuspid. There is mild calcification of the aortic valve. There is mild thickening of the aortic valve. Aortic valve regurgitation is not visualized. No aortic stenosis is present.  6. The inferior vena cava is dilated in size with <50% respiratory variability, suggesting right atrial pressure of 15 mmHg. FINDINGS  Left Ventricle: Left ventricular ejection fraction, by estimation, is 60 to 65%. The left ventricle has normal function. The left ventricle has no regional wall motion abnormalities. Strain was performed and the global longitudinal strain is abnormal. The left ventricular internal cavity size was normal in size. There is moderate left ventricular hypertrophy. The interventricular septum is flattened in systole and diastole, consistent with right ventricular pressure and volume overload. Left ventricular diastolic parameters are indeterminate. Right Ventricle: The right ventricular size is severely enlarged. Right vetricular wall thickness was not well visualized. Right ventricular systolic function moderately to severely reduced. There is severely elevated pulmonary artery systolic pressure. The tricuspid regurgitant velocity is 3.39 m/s, and with an assumed right atrial pressure of 15 mmHg, the estimated right ventricular systolic pressure is  61.0 mmHg. Left Atrium: Left atrial size was normal in size. Right Atrium: Right atrial size was normal in size. Pericardium: There is no evidence of pericardial effusion. Mitral Valve: The mitral valve is normal in structure. There is mild thickening of the mitral valve leaflet(s). There is mild calcification of the mitral valve leaflet(s). Mild mitral annular calcification. No evidence of mitral valve regurgitation. No evidence of mitral valve stenosis. Tricuspid Valve: The tricuspid valve is abnormal. Tricuspid valve regurgitation is moderate . No evidence of tricuspid stenosis. Aortic Valve: The aortic valve is tricuspid. There is mild calcification of the aortic valve. There is mild thickening of  the aortic valve. There is mild aortic valve annular calcification. Aortic valve regurgitation is not visualized. No aortic stenosis  is present. Aortic valve mean gradient measures 7.0 mmHg. Aortic valve peak gradient measures 17.0 mmHg. Aortic valve area, by VTI measures 1.28 cm. Pulmonic Valve: The pulmonic valve was not well visualized. Pulmonic valve regurgitation is not visualized. No evidence of pulmonic stenosis. Aorta: The aortic root is normal in size and structure. Venous: The inferior vena cava is dilated in size with less than 50% respiratory variability, suggesting right atrial pressure of 15 mmHg. IAS/Shunts: No atrial level shunt detected by color flow Doppler.  LEFT VENTRICLE PLAX 2D LVIDd:         4.00 cm   Diastology LVIDs:         2.70 cm   LV e' medial:    4.35 cm/s LV PW:         1.20 cm   LV E/e' medial:  18.0 LV IVS:        1.40 cm   LV e' lateral:   8.21 cm/s LVOT diam:     1.70 cm   LV E/e' lateral: 9.5 LV SV:         45 LV SV Index:   24 LVOT Area:     2.27 cm  LEFT ATRIUM             Index        RIGHT ATRIUM           Index LA diam:        3.60 cm 1.96 cm/m   RA Area:     21.50 cm LA Vol (A2C):   56.7 ml 30.86 ml/m  RA Volume:   64.35 ml  35.02 ml/m LA Vol (A4C):   25.6 ml 13.93 ml/m  LA Biplane Vol: 39.0 ml 21.23 ml/m  AORTIC VALVE AV Area (Vmax):    1.25 cm AV Area (Vmean):   1.27 cm AV Area (VTI):     1.28 cm AV Vmax:           206.00 cm/s AV Vmean:          124.000 cm/s AV VTI:            0.352 m AV Peak Grad:      17.0 mmHg AV Mean Grad:      7.0 mmHg LVOT Vmax:         113.00 cm/s LVOT Vmean:        69.600 cm/s LVOT VTI:          0.198 m LVOT/AV VTI ratio: 0.56  AORTA Ao Root diam: 3.40 cm MITRAL VALVE               TRICUSPID VALVE MV Area (PHT): 2.34 cm    TR Peak grad:   46.0 mmHg MV Decel Time: 324 msec    TR Vmax:        339.00 cm/s MV E velocity: 78.40 cm/s MV A velocity: 87.40 cm/s  SHUNTS MV E/A ratio:  0.90        Systemic VTI:  0.20 m                            Systemic Diam: 1.70 cm Dorn Ross MD Electronically signed by Dorn Ross MD Signature Date/Time: 03/15/2024/6:38:17 PM    Final    DG HIP UNILAT WITH PELVIS 2-3 VIEWS RIGHT Result Date: 03/14/2024 CLINICAL DATA:  Postop. EXAM: DG HIP (WITH OR WITHOUT PELVIS) 2-3V RIGHT COMPARISON:  Preoperative imaging FINDINGS: Right hip arthroplasty in expected alignment. No periprosthetic lucency or fracture. Recent postsurgical change includes air and edema in the soft tissues. Overlying skin staples in place. IMPRESSION: Right hip arthroplasty without immediate postoperative complication. Electronically Signed   By: Andrea Gasman M.D.   On: 03/14/2024 15:44   CT Angio Chest PE W/Cm &/Or Wo Cm Result Date: 03/13/2024 CLINICAL DATA:  Status post fall.  Hypoxia. EXAM: CT ANGIOGRAPHY CHEST WITH CONTRAST TECHNIQUE: Multidetector CT imaging of the chest was performed using the standard protocol during bolus administration of intravenous contrast. Multiplanar CT image reconstructions and MIPs were obtained to evaluate the vascular anatomy. RADIATION DOSE REDUCTION: This exam was performed according to the departmental dose-optimization program which includes automated exposure control, adjustment of the mA and/or kV  according to patient size and/or use of iterative reconstruction technique. CONTRAST:  75mL OMNIPAQUE  IOHEXOL  350 MG/ML SOLN COMPARISON:  CT chest dated 06/19/2021 FINDINGS: Cardiovascular: The study is high quality for the evaluation of pulmonary embolism. There are no filling defects in the central, lobar, segmental or subsegmental pulmonary artery branches to suggest acute pulmonary embolism. Heterogeneous enhancement of a right middle lobe segmental pulmonary artery (5:56 in an area of motion artifact. Great vessels are normal in course and caliber. Dilated main pulmonary artery measures 3.6 cm. Multichamber cardiomegaly. No significant pericardial fluid/thickening. Coronary artery calcifications and aortic atherosclerosis. Reflux of contrast material into the hepatic veins, suggesting a degree of right heart dysfunction. Mediastinum/Nodes: Imaged thyroid  gland without nodules meeting criteria for imaging follow-up by size. Normal esophagus. 11 mm precarinal lymph node (5:40), previously 9 mm, and 14 mm subcarinal lymph node (5:58), previously 10 mm. Lungs/Pleura: The central airways are patent. Adherent secretions within the trachea. Irregular consolidation within the dependent right upper lobe. Diffuse mosaic attenuation. Mild diffuse bronchial wall thickening with subsegmental mucous plugging. No pneumothorax. No pleural effusion. Upper abdomen: Normal. Musculoskeletal: No acute or abnormal lytic or blastic osseous lesions. Partially imaged inferior endplate compression of T12. Review of the MIP images confirms the above findings. IMPRESSION: 1. No evidence of acute pulmonary embolism. Heterogeneous enhancement of a right middle lobe segmental pulmonary artery in an area of motion artifact is favored to be artifactual. 2. Irregular consolidation within the dependent right upper lobe, suspicious for aspiration. 3. Diffuse mosaic attenuation, which can be seen in the setting of small airways disease. 4. Mildly  enlarged mediastinal lymph nodes, likely reactive. 5. Dilated main pulmonary artery, which can be seen in the setting of pulmonary arterial hypertension. 6. Multichamber cardiomegaly with reflux of contrast material into the hepatic veins, suggesting a degree of right heart dysfunction. 7.  Aortic Atherosclerosis (ICD10-I70.0). Electronically Signed   By: Limin  Xu M.D.   On: 03/13/2024 15:25   CT Head Wo Contrast Result Date: 03/13/2024 EXAM: CT HEAD WITHOUT CONTRAST 03/13/2024 02:45:58 PM TECHNIQUE: CT of the head was performed without the administration of intravenous contrast. Automated exposure control, iterative reconstruction, and/or weight based adjustment of the mA/kV was utilized to reduce the radiation dose to as low as reasonably achievable. COMPARISON: None available. CLINICAL HISTORY: Head trauma, minor (Age >= 65y). FINDINGS: BRAIN AND VENTRICLES: No acute hemorrhage. No evidence of acute infarct. No hydrocephalus. No extra-axial collection. No mass effect or midline shift. Age-related cerebral atrophy. Periventricular white matter changes, likely sequela of chronic small vessel ischemic disease. Remote left cerebellar infarct. Remote lacunar infarcts in the bilateral basal ganglia. Atherosclerotic calcifications  in intracranial carotid and vertebral arteries. ORBITS: Bilateral lens replacements. SINUSES: Near complete opacification of right maxillary sinus with osseous thickening, likely chronic sinusitis. Mucosal thickening in ethmoid air cells. SOFT TISSUES AND SKULL: No acute soft tissue abnormality. No skull fracture. IMPRESSION: 1. No acute intracranial abnormality. 2. Age-related cerebral atrophy, chronic small vessel ischemic disease, and remote left cerebellar infarct. 3. Near complete opacification of the right maxillary sinus with osseous thickening, likely chronic sinusitis, with additional mucosal thickening in the ethmoid air cells. Electronically signed by: Donnice Mania MD 03/13/2024  03:08 PM EST RP Workstation: [id number]   DG Chest 1 View Result Date: 03/13/2024 CLINICAL DATA:  Right hip fracture.  Pre-op clearance exam EXAM: CHEST  1 VIEW COMPARISON:  03/08/2022 FINDINGS: Moderate cardiomegaly again noted as well as ectasia of the thoracic aorta. Both lungs are clear. Several old right rib fracture deformities are noted. IMPRESSION: Moderate cardiomegaly. No active lung disease. Electronically Signed   By: Norleen DELENA Kil M.D.   On: 03/13/2024 13:15   DG Hip Unilat With Pelvis 2-3 Views Right Result Date: 03/13/2024 CLINICAL DATA:  Fall.  Right hip pain. EXAM: DG HIP (WITH OR WITHOUT PELVIS) 3V RIGHT COMPARISON:  None Available. FINDINGS: Displaced and impacted right femoral neck fracture is seen. No dislocation. Osteopenia noted. Degenerative changes are seen involving the pubic symphysis and lower lumbar spine. IMPRESSION: Displaced and impacted right femoral neck fracture. Electronically Signed   By: Norleen DELENA Kil M.D.   On: 03/13/2024 13:14     Discharge Exam: Vitals:   03/21/24 0304 03/21/24 0858  BP: (!) 141/82 (!) 166/105  Pulse: 72 88  Resp: 18   Temp: 98.6 F (37 C)   SpO2: 94%    Vitals:   03/20/24 1840 03/20/24 2248 03/21/24 0304 03/21/24 0858  BP: (!) 149/87 135/84 (!) 141/82 (!) 166/105  Pulse: 86 79 72 88  Resp:  18 18   Temp: 98.3 F (36.8 C) 97.9 F (36.6 C) 98.6 F (37 C)   TempSrc: Oral Oral    SpO2: 96% 98% 94%   Weight: 79.4 kg     Height: 5' 5.5 (1.664 m)       General: Pt is alert, awake, not in acute distress Cardiovascular: RRR, S1/S2 +, no rubs, no gallops Respiratory: CTA bilaterally, no wheezing, no rhonchi, nasal cannula oxygen Abdominal: Soft, NT, ND, bowel sounds + Extremities: no edema, no cyanosis    The results of significant diagnostics from this hospitalization (including imaging, microbiology, ancillary and laboratory) are listed below for reference.     Microbiology: Recent Results (from the past 240 hours)   Surgical pcr screen     Status: None   Collection Time: 03/13/24  3:50 PM   Specimen: Nasal Mucosa; Nasal Swab  Result Value Ref Range Status   MRSA, PCR NEGATIVE NEGATIVE Final   Staphylococcus aureus NEGATIVE NEGATIVE Final    Comment: (NOTE) The Xpert SA Assay (FDA approved for NASAL specimens in patients 43 years of age and older), is one component of a comprehensive surveillance program. It is not intended to diagnose infection nor to guide or monitor treatment. Performed at [facility], [address], [place], [place] [address]   Culture, blood (Routine X 2) w Reflex to ID Panel     Status: None (Preliminary result)   Collection Time: 03/19/24  8:23 AM   Specimen: BLOOD  Result Value Ref Range Status   Specimen Description BLOOD LEFT ANTECUBITAL  Final   Special Requests   Final  BOTTLES DRAWN AEROBIC AND ANAEROBIC Blood Culture adequate volume   Culture   Final    NO GROWTH 2 DAYS Performed at [facility], [address] [place], [place] [address]    Report Status PENDING  Incomplete  Culture, blood (Routine X 2) w Reflex to ID Panel     Status: None (Preliminary result)   Collection Time: 03/19/24  8:23 AM   Specimen: BLOOD  Result Value Ref Range Status   Specimen Description BLOOD BLOOD LEFT HAND  Final   Special Requests   Final    BOTTLES DRAWN AEROBIC AND ANAEROBIC Blood Culture adequate volume   Culture   Final    NO GROWTH 2 DAYS Performed at [facility], [address] [place], [place] [address]    Report Status PENDING  Incomplete  Resp panel by RT-PCR (RSV, Flu A&B, Covid) Anterior Nasal Swab     Status: None   Collection Time: 03/19/24  2:50 PM   Specimen: Anterior Nasal Swab  Result Value Ref Range Status   SARS Coronavirus 2 by RT PCR NEGATIVE NEGATIVE Final    Comment: (NOTE) SARS-CoV-2 target nucleic acids are NOT DETECTED.  The SARS-CoV-2 RNA is generally detectable in upper respiratory specimens during the acute phase of infection. The  lowest concentration of SARS-CoV-2 viral copies this assay can detect is 138 copies/mL. A negative result does not preclude SARS-Cov-2 infection and should not be used as the sole basis for treatment or other patient management decisions. A negative result may occur with  improper specimen collection/handling, submission of specimen other than nasopharyngeal swab, presence of viral mutation(s) within the areas targeted by this assay, and inadequate number of viral copies(<138 copies/mL). A negative result must be combined with clinical observations, patient history, and epidemiological information. The expected result is Negative.  Fact Sheet for Patients:  bloggercourse.com  Fact Sheet for Healthcare Providers:  seriousbroker.it  This test is no t yet approved or cleared by the [place]  FDA and  has been authorized for detection and/or diagnosis of SARS-CoV-2 by FDA under an Emergency Use Authorization (EUA). This EUA will remain  in effect (meaning this test can be used) for the duration of the COVID-19 declaration under Section 564(b)(1) of the Act, 21 U.S.C.section 360bbb-3(b)(1), unless the authorization is terminated  or revoked sooner.       Influenza A by PCR NEGATIVE NEGATIVE Final   Influenza B by PCR NEGATIVE NEGATIVE Final    Comment: (NOTE) The Xpert Xpress SARS-CoV-2/FLU/RSV plus assay is intended as an aid in the diagnosis of influenza from Nasopharyngeal swab specimens and should not be used as a sole basis for treatment. Nasal washings and aspirates are unacceptable for Xpert Xpress SARS-CoV-2/FLU/RSV testing.  Fact Sheet for Patients: bloggercourse.com  Fact Sheet for Healthcare Providers: seriousbroker.it  This test is not yet approved or cleared by the [place]  FDA and has been authorized for detection and/or diagnosis of SARS-CoV-2 by FDA under  an Emergency Use Authorization (EUA). This EUA will remain in effect (meaning this test can be used) for the duration of the COVID-19 declaration under Section 564(b)(1) of the Act, 21 U.S.C. section 360bbb-3(b)(1), unless the authorization is terminated or revoked.     Resp Syncytial Virus by PCR NEGATIVE NEGATIVE Final    Comment: (NOTE) Fact Sheet for Patients: bloggercourse.com  Fact Sheet for Healthcare Providers: seriousbroker.it  This test is not yet approved or cleared by the [place]  FDA and has been authorized for detection and/or diagnosis of  SARS-CoV-2 by FDA under an Emergency Use Authorization (EUA). This EUA will remain in effect (meaning this test can be used) for the duration of the COVID-19 declaration under Section 564(b)(1) of the Act, 21 U.S.C. section 360bbb-3(b)(1), unless the authorization is terminated or revoked.  Performed at [facility], [address], [place], [place] [address]      Labs: BNP (last 3 results) No results for input(s): BNP in the last 8760 hours. Basic Metabolic Panel: Recent Labs  Lab 03/17/24 2150 03/18/24 0439 03/19/24 0617 03/20/24 0426 03/21/24 0513  NA 143 142 136 138 142  K 3.0* 3.4* 3.5 3.3* 4.4  CL 98 98 93* 95* 102  CO2 31 40* 38* 38* 34*  GLUCOSE 104* 97 110* 101* 98  BUN 13 13 11 16 20   CREATININE 0.60 0.56 0.54 0.46 0.52  CALCIUM  8.4* 8.2* 8.5* 8.8* 9.3  MG 1.7  --  2.0  --  2.6*   Liver Function Tests: No results for input(s): AST, ALT, ALKPHOS, BILITOT, PROT, ALBUMIN in the last 168 hours. No results for input(s): LIPASE, AMYLASE in the last 168 hours. No results for input(s): AMMONIA in the last 168 hours. CBC: Recent Labs  Lab 03/15/24 0447 03/15/24 1047 03/16/24 0502 03/17/24 0607 03/18/24 0439 03/20/24 0426  WBC 12.3*  --  10.1 8.5 10.5 11.1*  NEUTROABS  --   --  7.0  --  7.6 8.1*  HGB 12.7 12.4 11.8* 11.2* 11.8* 12.5   HCT 39.6 40.1 38.2 35.7* 37.0 38.9  MCV 90.4  --  90.7 91.1 89.8 88.0  PLT 138*  --  143* 138* 151 189   Cardiac Enzymes: No results for input(s): CKTOTAL, CKMB, CKMBINDEX, TROPONINI in the last 168 hours. BNP: Invalid input(s): POCBNP CBG: Recent Labs  Lab 03/17/24 2050  GLUCAP 134*   D-Dimer No results for input(s): DDIMER in the last 72 hours. Hgb A1c No results for input(s): HGBA1C in the last 72 hours. Lipid Profile No results for input(s): CHOL, HDL, LDLCALC, TRIG, CHOLHDL, LDLDIRECT in the last 72 hours. Thyroid  function studies No results for input(s): TSH, T4TOTAL, T3FREE, THYROIDAB in the last 72 hours.  Invalid input(s): FREET3 Anemia work up No results for input(s): VITAMINB12, FOLATE, FERRITIN, TIBC, IRON, RETICCTPCT in the last 72 hours. Urinalysis    Component Value Date/Time   COLORURINE YELLOW 03/19/2024 1615   APPEARANCEUR CLEAR 03/19/2024 1615   LABSPEC 1.013 03/19/2024 1615   PHURINE 8.0 03/19/2024 1615   GLUCOSEU NEGATIVE 03/19/2024 1615   HGBUR NEGATIVE 03/19/2024 1615   BILIRUBINUR NEGATIVE 03/19/2024 1615   KETONESUR NEGATIVE 03/19/2024 1615   PROTEINUR NEGATIVE 03/19/2024 1615   NITRITE NEGATIVE 03/19/2024 1615   LEUKOCYTESUR NEGATIVE 03/19/2024 1615   Sepsis Labs Recent Labs  Lab 03/16/24 0502 03/17/24 0607 03/18/24 0439 03/20/24 0426  WBC 10.1 8.5 10.5 11.1*   Microbiology Recent Results (from the past 240 hours)  Surgical pcr screen     Status: None   Collection Time: 03/13/24  3:50 PM   Specimen: Nasal Mucosa; Nasal Swab  Result Value Ref Range Status   MRSA, PCR NEGATIVE NEGATIVE Final   Staphylococcus aureus NEGATIVE NEGATIVE Final    Comment: (NOTE) The Xpert SA Assay (FDA approved for NASAL specimens in patients 38 years of age and older), is one component of a comprehensive surveillance program. It is not intended to diagnose infection nor to guide or monitor  treatment. Performed at [facility], [address], [place], [place] [address]   Culture, blood (  Routine X 2) w Reflex to ID Panel     Status: None (Preliminary result)   Collection Time: 03/19/24  8:23 AM   Specimen: BLOOD  Result Value Ref Range Status   Specimen Description BLOOD LEFT ANTECUBITAL  Final   Special Requests   Final    BOTTLES DRAWN AEROBIC AND ANAEROBIC Blood Culture adequate volume   Culture   Final    NO GROWTH 2 DAYS Performed at [facility], [address] [place], [place] [address]    Report Status PENDING  Incomplete  Culture, blood (Routine X 2) w Reflex to ID Panel     Status: None (Preliminary result)   Collection Time: 03/19/24  8:23 AM   Specimen: BLOOD  Result Value Ref Range Status   Specimen Description BLOOD BLOOD LEFT HAND  Final   Special Requests   Final    BOTTLES DRAWN AEROBIC AND ANAEROBIC Blood Culture adequate volume   Culture   Final    NO GROWTH 2 DAYS Performed at [facility], [address] [place], [place] [address]    Report Status PENDING  Incomplete  Resp panel by RT-PCR (RSV, Flu A&B, Covid) Anterior Nasal Swab     Status: None   Collection Time: 03/19/24  2:50 PM   Specimen: Anterior Nasal Swab  Result Value Ref Range Status   SARS Coronavirus 2 by RT PCR NEGATIVE NEGATIVE Final    Comment: (NOTE) SARS-CoV-2 target nucleic acids are NOT DETECTED.  The SARS-CoV-2 RNA is generally detectable in upper respiratory specimens during the acute phase of infection. The lowest concentration of SARS-CoV-2 viral copies this assay can detect is 138 copies/mL. A negative result does not preclude SARS-Cov-2 infection and should not be used as the sole basis for treatment or other patient management decisions. A negative result may occur with  improper specimen collection/handling, submission of specimen other than nasopharyngeal swab, presence of viral mutation(s) within the areas targeted by this assay, and inadequate number of  viral copies(<138 copies/mL). A negative result must be combined with clinical observations, patient history, and epidemiological information. The expected result is Negative.  Fact Sheet for Patients:  bloggercourse.com  Fact Sheet for Healthcare Providers:  seriousbroker.it  This test is no t yet approved or cleared by the [place]  FDA and  has been authorized for detection and/or diagnosis of SARS-CoV-2 by FDA under an Emergency Use Authorization (EUA). This EUA will remain  in effect (meaning this test can be used) for the duration of the COVID-19 declaration under Section 564(b)(1) of the Act, 21 U.S.C.section 360bbb-3(b)(1), unless the authorization is terminated  or revoked sooner.       Influenza A by PCR NEGATIVE NEGATIVE Final   Influenza B by PCR NEGATIVE NEGATIVE Final    Comment: (NOTE) The Xpert Xpress SARS-CoV-2/FLU/RSV plus assay is intended as an aid in the diagnosis of influenza from Nasopharyngeal swab specimens and should not be used as a sole basis for treatment. Nasal washings and aspirates are unacceptable for Xpert Xpress SARS-CoV-2/FLU/RSV testing.  Fact Sheet for Patients: bloggercourse.com  Fact Sheet for Healthcare Providers: seriousbroker.it  This test is not yet approved or cleared by the [place]  FDA and has been authorized for detection and/or diagnosis of SARS-CoV-2 by FDA under an Emergency Use Authorization (EUA). This EUA will remain in effect (meaning this test can be used) for the duration of the COVID-19 declaration under Section 564(b)(1) of the Act, 21 U.S.C. section 360bbb-3(b)(1), unless the authorization is terminated or revoked.  Resp Syncytial Virus by PCR NEGATIVE NEGATIVE Final    Comment: (NOTE) Fact Sheet for Patients: bloggercourse.com  Fact Sheet for Healthcare  Providers: seriousbroker.it  This test is not yet approved or cleared by the [place]  FDA and has been authorized for detection and/or diagnosis of SARS-CoV-2 by FDA under an Emergency Use Authorization (EUA). This EUA will remain in effect (meaning this test can be used) for the duration of the COVID-19 declaration under Section 564(b)(1) of the Act, 21 U.S.C. section 360bbb-3(b)(1), unless the authorization is terminated or revoked.  Performed at [facility], [address] [place], [place] [address]      Time coordinating discharge: 35 minutes  SIGNED:   Adron JONETTA Fairly, DO [facility] 03/21/2024, 10:45 AM  If 7PM-7AM, please contact night-coverage [email or web address]     [1]  Allergies Allergen Reactions   Pravastatin Dermatitis and Rash

## 2024-03-21 NOTE — Plan of Care (Signed)
°  Problem: Education: Goal: Knowledge of General Education information will improve Description: Including pain rating scale, medication(s)/side effects and non-pharmacologic comfort measures Outcome: Progressing   Problem: Health Behavior/Discharge Planning: Goal: Ability to manage health-related needs will improve Outcome: Progressing   Problem: Clinical Measurements: Goal: Ability to maintain clinical measurements within normal limits will improve Outcome: Progressing Goal: Will remain free from infection Outcome: Progressing Goal: Diagnostic test results will improve Outcome: Progressing Goal: Respiratory complications will improve Outcome: Progressing Goal: Cardiovascular complication will be avoided Outcome: Progressing   Problem: Activity: Goal: Risk for activity intolerance will decrease Outcome: Progressing   Problem: Nutrition: Goal: Adequate nutrition will be maintained Outcome: Progressing   Problem: Elimination: Goal: Will not experience complications related to bowel motility Outcome: Progressing   Problem: Pain Managment: Goal: General experience of comfort will improve and/or be controlled Outcome: Progressing   Problem: Safety: Goal: Ability to remain free from injury will improve Outcome: Progressing   Problem: Skin Integrity: Goal: Risk for impaired skin integrity will decrease Outcome: Progressing   Problem: Education: Goal: Verbalization of understanding the information provided (i.e., activity precautions, restrictions, etc) will improve Outcome: Progressing Goal: Individualized Educational Video(s) Outcome: Progressing   Problem: Activity: Goal: Ability to ambulate and perform ADLs will improve Outcome: Progressing   Problem: Clinical Measurements: Goal: Postoperative complications will be avoided or minimized Outcome: Progressing   Problem: Self-Concept: Goal: Ability to maintain and perform role responsibilities to the fullest  extent possible will improve Outcome: Progressing   Problem: Pain Management: Goal: Pain level will decrease Outcome: Progressing

## 2024-03-22 ENCOUNTER — Telehealth: Payer: Self-pay | Admitting: Orthopedic Surgery

## 2024-03-22 LAB — RHEUMATOID FACTOR: Rheumatoid fact SerPl-aCnc: 17 [IU]/mL — ABNORMAL HIGH (ref <14.0)

## 2024-03-22 LAB — ANA W/REFLEX IF POSITIVE: Anti Nuclear Antibody (ANA): NEGATIVE

## 2024-03-22 NOTE — Telephone Encounter (Signed)
 Dr. Areatha pt - spoke w/Cathy at [facility] [phone], fax # [phone], they need an order faxed: for Remove staples at 12 to 14 days

## 2024-03-22 NOTE — Telephone Encounter (Signed)
 Faxed op note, its on there

## 2024-03-24 LAB — CULTURE, BLOOD (ROUTINE X 2)
Culture: NO GROWTH
Culture: NO GROWTH
Special Requests: ADEQUATE
Special Requests: ADEQUATE

## 2024-04-10 DIAGNOSIS — G629 Polyneuropathy, unspecified: Secondary | ICD-10-CM | POA: Insufficient documentation

## 2024-04-12 ENCOUNTER — Ambulatory Visit (INDEPENDENT_AMBULATORY_CARE_PROVIDER_SITE_OTHER): Admitting: Orthopedic Surgery

## 2024-04-12 ENCOUNTER — Encounter: Payer: Self-pay | Admitting: Orthopedic Surgery

## 2024-04-12 DIAGNOSIS — S72001A Fracture of unspecified part of neck of right femur, initial encounter for closed fracture: Secondary | ICD-10-CM

## 2024-04-12 NOTE — Progress Notes (Signed)
"  ° °  POST OP VISIT   Patient: Mary Marks           Date of Birth: [birth year]           MRN: [id number] Visit Date: 04/12/2024 Requested by: [name] [name], FNP [facility] [address] [place],  [place] [address] PCP: [name] [name], FNP   Encounter Diagnosis  Name Primary?   Displaced fracture of right femoral neck (HCC) 03/14/24 Hemiarthroplasty Yes   PROCEDURE: Partial hip replacement   Implants: Implant Name Type Inv. Item Serial No. Manufacturer Lot No. LRB No. Used Action  STEM FEMORAL SZ STD ACTIS - [id number] Stem STEM FEMORAL SZ STD ACTIS   [employer] [id number] Right 1 Implanted  HEAD BIPOLAR DEPUY 48 - [id number] Hips HEAD BIPOLAR DEPUY 48   [employer] [id number] Right 1 Implanted  HEAD FEM STD 28X+8.5 STRL - [id number] Hips HEAD FEM STD 28X+8.5 STRL   [employer] [id number] Right 1 Implanted     Chief Complaint  Patient presents with   Post-op Follow-up    Allergies[1]  ASSESSMENT AND PLAN:  4-week follow-up bipolar hip replacement for right femoral neck fracture.  Patient doing reasonably well  She is weightbearing as tolerated  Her staples were removed her wound looks clean dry and intact with no signs of infection  She can flex her hip up about 110 degrees with some weakness  Recommend   continue physical therapy  Weight-bear as tolerated  Oxygen wean trial.  Follow-up 2 months  I have reviewed the patient's history and given the presence of a fragility fracture, I have deemed the necessity of a osteoporosis management referral or confirmed that the patient is currently enrolled in a osteoporosis treatment program.  Per [facility] clinic policy, our goal is ensure optimal postoperative pain control with a multimodal pain management strategy. For all [facility] patients, our goal is to wean post-operative narcotic medications by 6 weeks post-operatively. If this is not  possible due to utilization of pain medication prior to surgery, your [facility] doctor will support your acute post-operative pain control for the first 6 weeks postoperatively, with a plan to transition you back to your primary pain team following that. Maralee will work to ensure a therapist, occupational.     [1]  Allergies Allergen Reactions   Pravastatin Dermatitis and Rash   "

## 2024-04-12 NOTE — Progress Notes (Signed)
" ° ° °  04/12/2024   Chief Complaint  Patient presents with   Post-op Follow-up    Encounter Diagnosis  Name Primary?   Displaced fracture of right femoral neck (HCC) 03/14/24 Hemiarthroplasty Yes    What pharmacy do you use ? ____[facility] is pharmacy facility uses_______________________  DOI/DOS/ Date: 03/14/24  Did you get better, worse or no change (Answer below)   Improved      "

## 2024-04-16 NOTE — Progress Notes (Unsigned)
 " Cardiology Office Note:  .   Date:  04/18/2024  ID:  Mary Marks, DOB [birth year], MRN [id number] PCP: Mary Harvey [name], FNP  [facility] Providers Cardiologist:  Alvan Carrier, MD {  History of Present Illness: Mary Marks is a 89 y.o. female  with PMHx of HTN, HLD, hypothyroidism, OA with recently diagnosed RV dysfunction, pulmonary hypertension, moderate TV regurgitation who reports to [facility] office for hospital follow up.   Pertinent cardiac medical history:  Echo 03/15/2024: EF 60 to 65%, moderate LVH, RV pressure and volume overload, abnormal global longitudinal strain, moderate to severely reduced RV systolic function, severely enlarged RV size, severely elevated PASP, moderate TV regurgitation, mild AV thickening/calcification without AS.   Recent hospitalization 12/8-16/2025 for fall at home and found to have a displaced right femoral neck fracture.  Underwent right femoral neck fracture repair on 12/9.  Developed hypoxemia with workup suggestive of aspiration pneumonia, cardiomegaly, dilated pulmonary artery by CTA. First seen by cardiology during consult. Echo as above concerning for RV dysfunction and pulmonary HTN that was felt exacerbated by pneumonia, hypoxia, orthopedic surgery, and IV fluids. Recommended outpatient test including PFTs, OSA eval, VQ scan when further recovered from her pneumonia. Also noted low BP and lactic acidosis that resolved with IV fluid and required midodrine  that was discontinued. Subsequently diuresed with IV lasix  and transitioned to oral Lasix . PICC  line was placed with co-ox WNL. She completed abx treatment and did not require abx on discharge. Discharged to SNF.  Discontinued lisinopril  40 mg daily due to hypotension.  Started on Lasix , Lovenox , and Toprol  Xl this admission. Continued on Lipitor 20 mg daily, Lasix  40 mg daily, Toprol  XL 25 mg daily, Lovenox  40 mg daily.   Today, she reports completion of antibiotics for pneumonia  and feels she has recovered without residual respiratory symptoms. She is doing well in rehabilitation, participating in near-daily physical therapy, and ambulates with a walker. She reports feeling overwhelmed and frustrated with the transition from hospital to SNF and endorses difficulty sleeping, which she attributes to the new environment. She wishes to focus on recovery from pneumonia and surgery and was initially hesitant about further testing; however, after discussion, she agrees to proceed with pulmonary function testing and V/Q scan, understanding these will not interfere with rehabilitation. She declines sleep study at this time but may consider it in the future. She denies known snoring. She denies chest pain, shortness of breath, palpitations, syncope, presyncope, dizziness, orthopnea, PND, edema, significant weight changes, acute bleeding, or claudication. Reports medication compliance. Denies tobacco, alcohol, or illicit drug use.  She also reports intermittent numbness radiating down the left leg, which seem consistent with neuropathy. She has not yet followed up with her PCP since surgery. Encouraged to follow up and discuss with PCP.   ROS: 10 point review of system has been reviewed and considered negative except ones been listed in the HPI.   Studies Reviewed: SABRA    ECHO IMPRESSIONS 03/15/2024  1. Left ventricular ejection fraction, by estimation, is 60 to 65%. The  left ventricle has normal function. The left ventricle has no regional  wall motion abnormalities. There is moderate left ventricular hypertrophy.  Left ventricular diastolic  parameters are indeterminate. There is the interventricular septum is  flattened in systole and diastole, consistent with right ventricular  pressure and volume overload. The global longitudinal strain is abnormal.   2. Right ventricular systolic function moderately to severely reduced.  The right ventricular size is  severely enlarged. There is  severely  elevated pulmonary artery systolic pressure.   3. The mitral valve is normal in structure. No evidence of mitral valve  regurgitation. No evidence of mitral stenosis.   4. The tricuspid valve is abnormal. Tricuspid valve regurgitation is  moderate.   5. The aortic valve is tricuspid. There is mild calcification of the  aortic valve. There is mild thickening of the aortic valve. Aortic valve  regurgitation is not visualized. No aortic stenosis is present.   6. The inferior vena cava is dilated in size with <50% respiratory  variability, suggesting right atrial pressure of 15 mmHg.   CV Studies: Cardiac studies reviewed are outlined and summarized above. Otherwise please see EMR for full report.   Physical Exam:   VS:  BP 116/78 (BP Location: Right Arm, Cuff Size: Normal)   Pulse 80   Ht 5' 5 (1.651 m)   Wt 171 lb 3.2 oz (77.7 kg)   SpO2 93%   BMI 28.49 kg/m    Wt Readings from Last 3 Encounters:  04/18/24 171 lb 3.2 oz (77.7 kg)  03/20/24 175 lb 0.7 oz (79.4 kg)  08/08/21 191 lb 12.8 oz (87 kg)    GEN: Well nourished, well developed in no acute distress while sitting in wheelchair accompanied by staff from facility.   NECK: No JVD; No carotid bruits CARDIAC: RRR, no murmurs, rubs, gallops RESPIRATORY:  Clear to auscultation without rales, wheezing or rhonchi  ABDOMEN: Soft, non-tender, non-distended EXTREMITIES:  No edema; No deformity   ASSESSMENT AND PLAN: .   Dysfunction of right cardiac ventricle Pulmonary hypertension, unspecified (HCC) Echo 03/15/2024: EF 60 to 65%, moderate LVH, RV pressure and volume overload, abnormal global longitudinal strain, moderate to severely reduced RV systolic function, severely enlarged RV size, severely elevated PASP, moderate TV regurgitation, mild AV thickening/calcification without AS.  Per JB, suspected chronic underlying RV dysfunction felt exacerbated by pneumonia, hypoxia, orthopedic surgery, and IV fluids. Pulm. HTN  suspected 2/2 to underlying pulmonary disease (Group 3). Order pulmonary function tests (PFTs). Order V/Q scan to evaluate for chronic thromboembolic disease. Also order CXR to be completed 30 days prior to V/Q.  Sleep study discussed; patient declines at this time but may reconsider in the future. Continue on Lasix  40 mg daily  Moderate Tricuspid Regurgitation Likely functional in setting of RV dilation and pulmonary hypertension.  Continue to monitor.   Primary hypertension BP this OV well controlled today: 116/78 Continue on Lasix  40 mg daily, Toprol  XL 25 mg daily.  Encourage physical activity for 150 minutes per week and heart healthy low sodium diet. Discussed limiting sodium intake to < 2 grams daily.     Hyperlipidemia LDL goal <100 LDL is not  at goal as below. Increase Lipitor 20 to 40  mg daily. Order f/u FLP in 8 weeks.  Previously noted allergy to pravastatin with dermatitis and rash in 2021.   Lipid Panel - 12/2023 from [facility]      Dispo: Follow up in 3 months.   Signed, Lorette CINDERELLA Kapur, PA-C  "

## 2024-04-18 ENCOUNTER — Encounter: Payer: Self-pay | Admitting: Physician Assistant

## 2024-04-18 ENCOUNTER — Encounter: Payer: Self-pay | Admitting: Family Medicine

## 2024-04-18 ENCOUNTER — Ambulatory Visit: Attending: Physician Assistant | Admitting: Physician Assistant

## 2024-04-18 ENCOUNTER — Ambulatory Visit: Payer: Self-pay | Admitting: Physician Assistant

## 2024-04-18 VITALS — BP 116/78 | HR 80 | Ht 65.0 in | Wt 171.2 lb

## 2024-04-18 DIAGNOSIS — I272 Pulmonary hypertension, unspecified: Secondary | ICD-10-CM | POA: Diagnosis not present

## 2024-04-18 DIAGNOSIS — I071 Rheumatic tricuspid insufficiency: Secondary | ICD-10-CM

## 2024-04-18 DIAGNOSIS — I519 Heart disease, unspecified: Secondary | ICD-10-CM | POA: Diagnosis not present

## 2024-04-18 DIAGNOSIS — E785 Hyperlipidemia, unspecified: Secondary | ICD-10-CM | POA: Diagnosis not present

## 2024-04-18 DIAGNOSIS — I361 Nonrheumatic tricuspid (valve) insufficiency: Secondary | ICD-10-CM | POA: Diagnosis not present

## 2024-04-18 DIAGNOSIS — I10 Essential (primary) hypertension: Secondary | ICD-10-CM | POA: Diagnosis not present

## 2024-04-18 MED ORDER — ATORVASTATIN CALCIUM 40 MG PO TABS: 40.0000 mg | ORAL_TABLET | Freq: Every day | ORAL | 3 refills | Status: AC

## 2024-04-18 NOTE — Patient Instructions (Signed)
 Medication Instructions:  Your physician has recommended you make the following change in your medication:   Increase Lipitor to 40 mg daily   *If you need a refill on your cardiac medications before your next appointment, please call your pharmacy*  Lab Work: Your physician recommends that you return for lab work in: 8 Weeks at [facility]  If you have labs (blood work) drawn today and your tests are completely normal, you will receive your results only by: [employer] (if you have MyChart) OR A paper copy in the mail If you have any lab test that is abnormal or we need to change your treatment, we will call you to review the results.  Testing/Procedures: A chest x-ray takes a picture of the organs and structures inside the chest, including the heart, lungs, and blood vessels. This test can show several things, including, whether the heart is enlarges; whether fluid is building up in the lungs; and whether pacemaker / defibrillator leads are still in place.   Your physician has recommended that you have a pulmonary function test. Pulmonary Function Tests are a group of tests that measure how well air moves in and out of your lungs.   VQ Scan   Follow-Up: At [facility], you and your health needs are our priority.  As part of our continuing mission to provide you with exceptional heart care, our providers are all part of one team.  This team includes your primary Cardiologist (physician) and Advanced Practice Providers or APPs (Physician Assistants and Nurse Practitioners) who all work together to provide you with the care you need, when you need it.  Your next appointment:   3 month(s)  Provider:   Lorette Kapur, PA-C     We recommend signing up for the patient portal called MyChart.  Sign up information is provided on this After Visit Summary.  MyChart is used to connect with patients for Virtual Visits (Telemedicine).  Patients are able to view lab/test results,  encounter notes, upcoming appointments, etc.  Non-urgent messages can be sent to your provider as well.   To learn more about what you can do with MyChart, go to [email or web address].   Other Instructions Thank you for choosing [facility]!

## 2024-04-25 ENCOUNTER — Ambulatory Visit (HOSPITAL_COMMUNITY)

## 2024-04-27 ENCOUNTER — Encounter: Admitting: Orthopedic Surgery

## 2024-05-10 ENCOUNTER — Telehealth: Payer: Self-pay | Admitting: Orthopedic Surgery

## 2024-05-10 NOTE — Telephone Encounter (Signed)
" °  Weightbearing as tolerated Direct lateral hip precautions  He is asking specifically what you don't want her to do, states he has never had a patient with direct lateral hip precautions  "

## 2024-05-10 NOTE — Telephone Encounter (Signed)
 Dr. Areatha pt - spoke w/Don Rudolpho w/[facility] [phone], he is wanting to know if this patient still has precautions and if so, what they are.

## 2024-05-11 NOTE — Telephone Encounter (Signed)
I called him to advise he voiced understanding

## 2024-06-26 ENCOUNTER — Encounter: Admitting: Orthopedic Surgery

## 2024-07-19 ENCOUNTER — Ambulatory Visit: Admitting: Physician Assistant
